# Patient Record
Sex: Female | Born: 1960 | Race: White | Hispanic: No | Marital: Married | Smoking: Never smoker
Health system: Southern US, Community
[De-identification: ages and names within clinical notes are randomized; demographics above are authoritative.]

## PROBLEM LIST (undated history)

## (undated) DIAGNOSIS — G43909 Migraine, unspecified, not intractable, without status migrainosus: Secondary | ICD-10-CM

## (undated) HISTORY — DX: Migraine, unspecified, not intractable, without status migrainosus: G43.909

---

## 1978-07-04 HISTORY — PX: BREAST SURGERY: SHX581

## 2001-06-21 ENCOUNTER — Encounter: Payer: Self-pay | Admitting: Family Medicine

## 2001-06-21 LAB — CONVERTED CEMR LAB
RBC count: 4.66 10*6/uL
TSH: 2.24 microintl units/mL
WBC, blood: 4.8 10*3/uL

## 2004-06-04 ENCOUNTER — Ambulatory Visit: Payer: Self-pay | Admitting: Family Medicine

## 2004-07-20 ENCOUNTER — Ambulatory Visit: Payer: Self-pay | Admitting: General Surgery

## 2004-08-31 ENCOUNTER — Ambulatory Visit: Payer: Self-pay | Admitting: Family Medicine

## 2004-08-31 LAB — CONVERTED CEMR LAB: TSH: 1.61 microintl units/mL

## 2005-05-19 ENCOUNTER — Ambulatory Visit: Payer: Self-pay | Admitting: Family Medicine

## 2005-12-29 ENCOUNTER — Ambulatory Visit: Payer: Self-pay | Admitting: General Surgery

## 2006-01-23 IMAGING — CT CT ABD-PELV W/ CM
1 of 5 series · 14 of 32 positions shown, 19 images · non-contrast
Comparison: none

REASON FOR EXAM: LT lower abd mass, abd pain
COMMENTS:

[Series 3: inspace · axial · 0.53mm/px · z∈[-477,-120]mm · 14 of 399 slices shown, 19 images]
[im 21/399  soft-tissue]
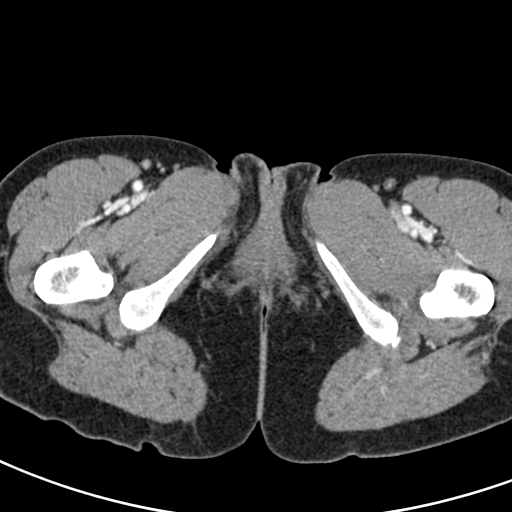
[im 21/399  bone]
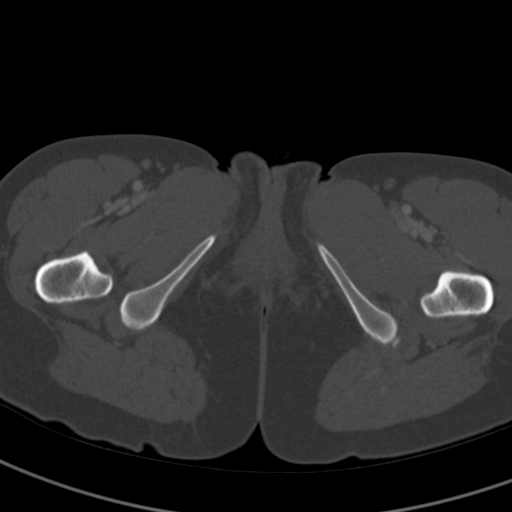
[im 63/399  soft-tissue]
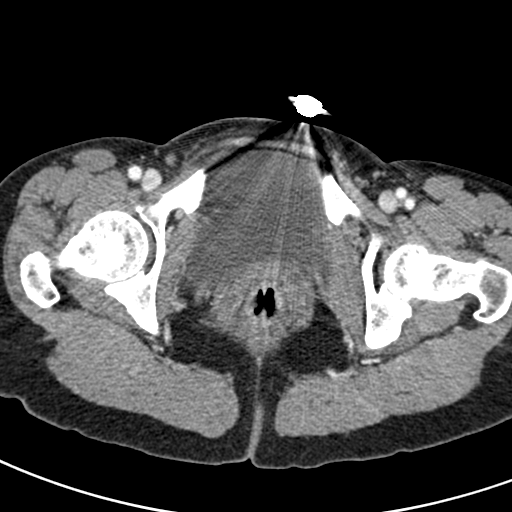
[im 84/399  soft-tissue]
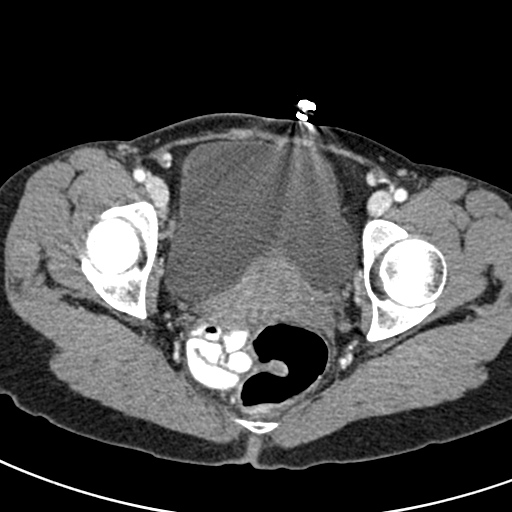
[im 105/399  soft-tissue]
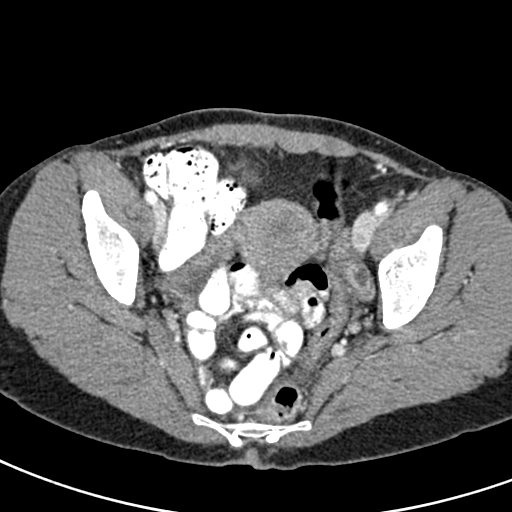
[im 147/399  soft-tissue]
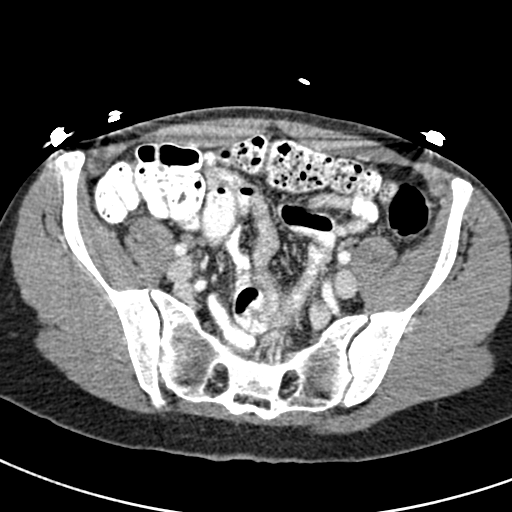
[im 168/399  soft-tissue]
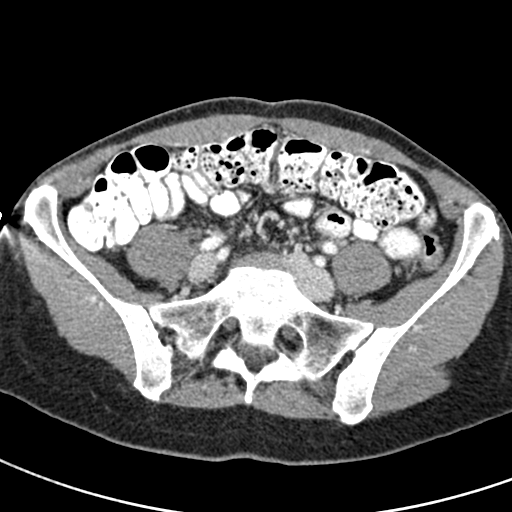
[im 210/399  soft-tissue]
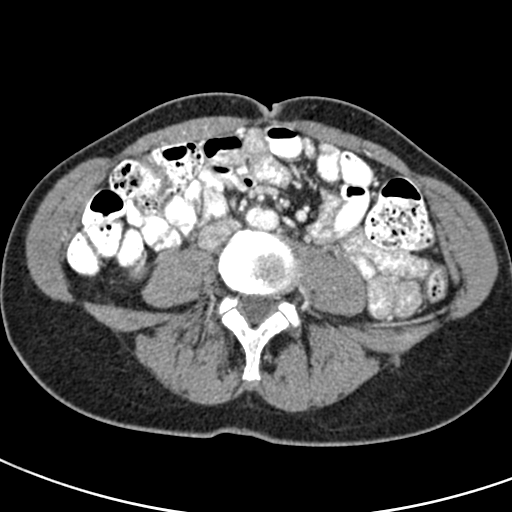
[im 231/399  soft-tissue]
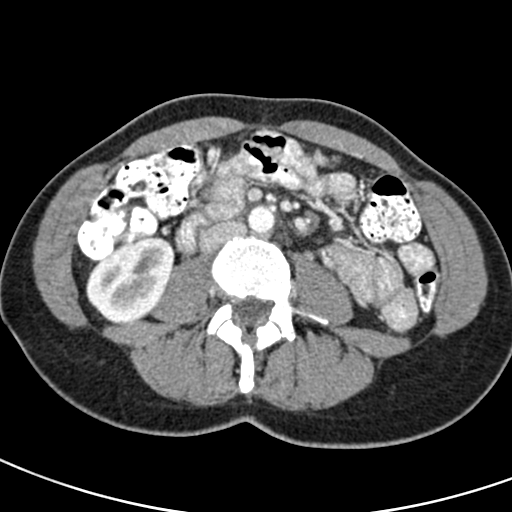
[im 252/399  soft-tissue]
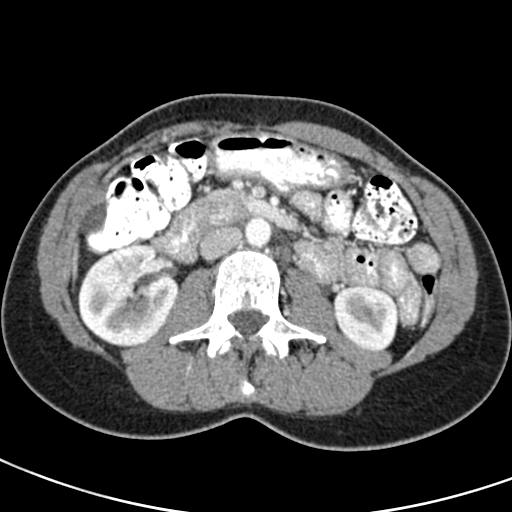
[im 252/399  bone]
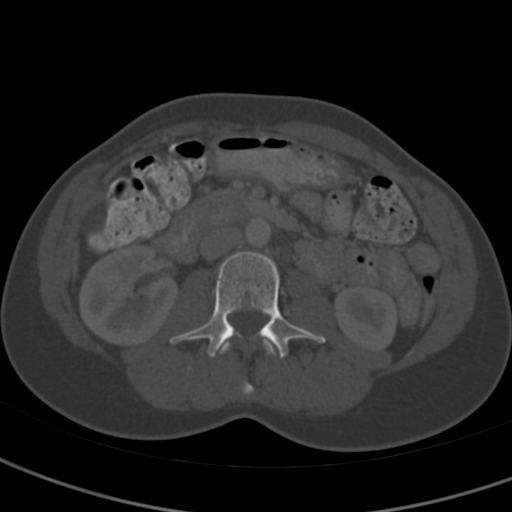
[im 294/399  soft-tissue]
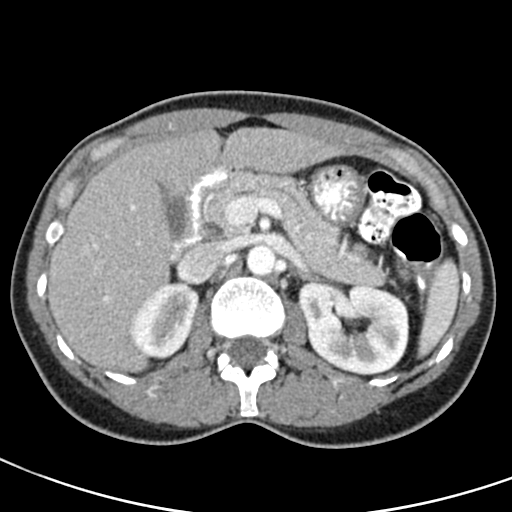
[im 315/399  soft-tissue]
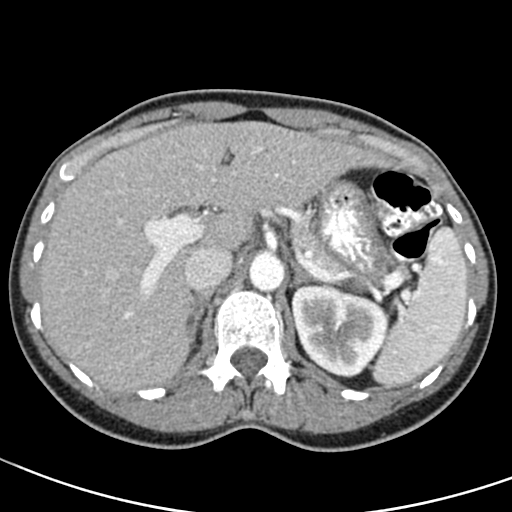
[im 315/399  lung]
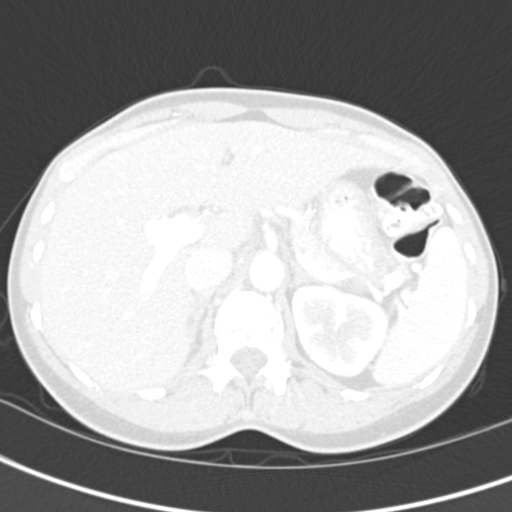
[im 336/399  soft-tissue]
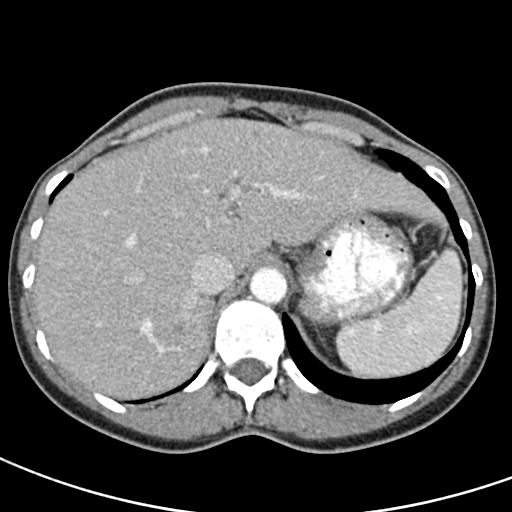
[im 336/399  lung]
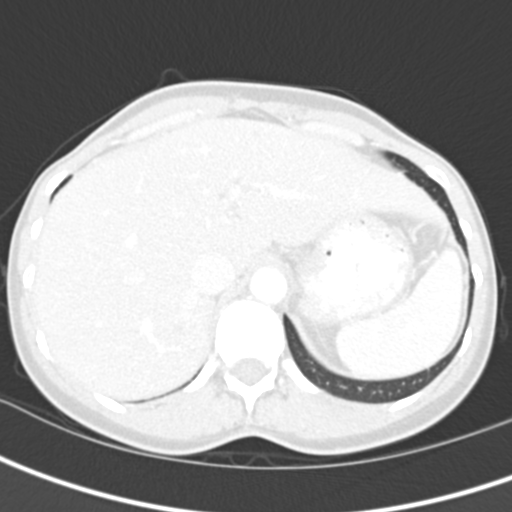
[im 357/399  lung]
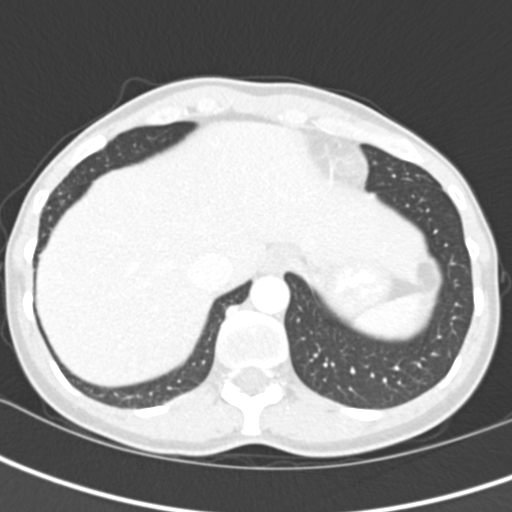
[im 378/399  soft-tissue]
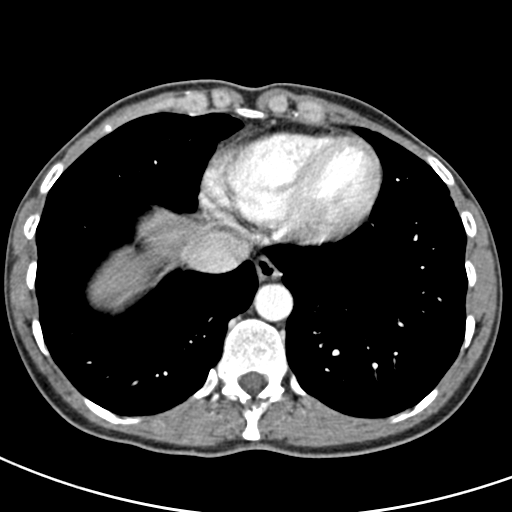
[im 378/399  lung]
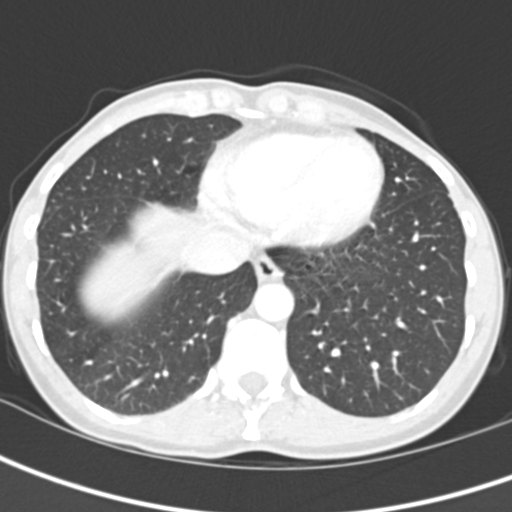

[14 of 32 positions shown; findings below may reference images not displayed]

PROCEDURE:     CT  - CT ABDOMEN / PELVIS  W  - July 20, 2004 [DATE]

RESULT:     Spiral 8 mm sections were obtained from the lung bases through
the pubic symphysis status post intravenous administration of 100 ccs of
Isovue 370.

Evaluation of the lung bases demonstrated no evidence of focal infiltrates,
effusions or edema.

The liver demonstrates an area of low attenuation along the anteroapical
periphery of the RIGHT lobe of the liver. This area demonstrates no evidence
of enhancement on immediate and delayed images and likely represents a cyst.
 Note; an atypical hemangioma cannot be excluded. A second smaller area of
low attenuation projects along the medial border of the apex of the liver
demonstrating near isointensity of the liver on delayed images and also
likely representing a small hemangioma.  A third area is demonstrated along
the posterior aspect of the LEFT lobe of the liver with no evidence of
enhancement again likely representing a cyst versus an atypical hemangioma.
No further liver masses are demonstrated.  The spleen, pancreas, adrenals
and kidneys are unremarkable.  There is no evidence of abdominopelvic
masses, free fluid, nor drainable loculated fluid collections.
IMPRESSION: 1)Likely cysts as well as possible small hemangiomas within the liver.
Otherwise, no further abdominal masses, free fluid nor drainable loculated
fluid collections are appreciated.

## 2006-05-17 ENCOUNTER — Ambulatory Visit: Payer: Self-pay | Admitting: Family Medicine

## 2006-06-03 ENCOUNTER — Encounter: Payer: Self-pay | Admitting: Family Medicine

## 2006-06-03 LAB — CONVERTED CEMR LAB: Pap Smear: NORMAL

## 2006-08-07 ENCOUNTER — Ambulatory Visit: Payer: Self-pay | Admitting: Family Medicine

## 2006-08-07 LAB — CONVERTED CEMR LAB
ALT: 30 units/L (ref 0–40)
AST: 32 units/L (ref 0–37)
Albumin: 3.6 g/dL (ref 3.5–5.2)
Alkaline Phosphatase: 28 units/L — ABNORMAL LOW (ref 39–117)
BUN: 14 mg/dL (ref 6–23)
Basophils Absolute: 0 10*3/uL (ref 0.0–0.1)
Basophils Relative: 0.5 % (ref 0.0–1.0)
Bilirubin, Direct: 0.1 mg/dL (ref 0.0–0.3)
Blood Glucose, Fasting: 92 mg/dL
CO2: 30 meq/L (ref 19–32)
Calcium: 8.8 mg/dL (ref 8.4–10.5)
Chloride: 110 meq/L (ref 96–112)
Cholesterol: 123 mg/dL (ref 0–200)
Creatinine, Ser: 0.7 mg/dL (ref 0.4–1.2)
Eosinophils Absolute: 0.1 10*3/uL (ref 0.0–0.6)
Eosinophils Relative: 1.7 % (ref 0.0–5.0)
GFR calc Af Amer: 115 mL/min
GFR calc non Af Amer: 95 mL/min
Glucose, Bld: 92 mg/dL (ref 70–99)
HCT: 38.2 % (ref 36.0–46.0)
HDL: 43.9 mg/dL (ref >39.0)
Hemoglobin: 13.2 g/dL (ref 12.0–15.0)
LDL Cholesterol: 70 mg/dL (ref 0–99)
Lymphocytes Relative: 22.2 % (ref 12.0–46.0)
MCHC: 34.5 g/dL (ref 30.0–36.0)
MCV: 91.5 fL (ref 78.0–100.0)
Monocytes Absolute: 0.3 10*3/uL (ref 0.2–0.7)
Monocytes Relative: 8.3 % (ref 3.0–11.0)
Neutro Abs: 2.6 10*3/uL (ref 1.4–7.7)
Neutrophils Relative %: 67.3 % (ref 43.0–77.0)
Platelets: 236 10*3/uL (ref 150–400)
Potassium: 4.6 meq/L (ref 3.5–5.1)
RBC: 4.17 M/uL (ref 3.87–5.11)
RDW: 11.9 % (ref 11.5–14.6)
Sodium: 143 meq/L (ref 135–145)
TSH: 1.41 microintl units/mL (ref 0.35–5.50)
Total Bilirubin: 0.5 mg/dL (ref 0.3–1.2)
Total CHOL/HDL Ratio: 2.8
Total Protein: 6 g/dL (ref 6.0–8.3)
Triglycerides: 44 mg/dL (ref 0–149)
VLDL: 9 mg/dL (ref 0–40)
WBC: 3.9 10*3/uL — ABNORMAL LOW (ref 4.5–10.5)

## 2006-08-09 ENCOUNTER — Ambulatory Visit: Payer: Self-pay | Admitting: Family Medicine

## 2007-05-18 ENCOUNTER — Ambulatory Visit: Payer: Self-pay | Admitting: Family Medicine

## 2007-08-22 ENCOUNTER — Ambulatory Visit: Payer: Self-pay | Admitting: Family Medicine

## 2007-08-22 LAB — CONVERTED CEMR LAB
CO2: 29 meq/L (ref 19–32)
Chloride: 108 meq/L (ref 96–112)
Cholesterol: 151 mg/dL (ref 0–200)
GFR calc non Af Amer: 113 mL/min
Glucose, Bld: 90 mg/dL (ref 70–99)
HDL: 58.6 mg/dL (ref >39.0)
LDL Cholesterol: 84 mg/dL (ref 0–99)
Sodium: 140 meq/L (ref 135–145)
TSH: 1.72 microintl units/mL (ref 0.35–5.50)
Total CHOL/HDL Ratio: 2.6

## 2007-08-28 ENCOUNTER — Ambulatory Visit: Payer: Self-pay | Admitting: Family Medicine

## 2007-08-28 DIAGNOSIS — G43109 Migraine with aura, not intractable, without status migrainosus: Secondary | ICD-10-CM | POA: Insufficient documentation

## 2007-08-29 ENCOUNTER — Encounter: Payer: Self-pay | Admitting: Family Medicine

## 2007-08-29 ENCOUNTER — Telehealth: Payer: Self-pay | Admitting: Family Medicine

## 2007-08-30 ENCOUNTER — Encounter: Payer: Self-pay | Admitting: Family Medicine

## 2007-09-24 ENCOUNTER — Telehealth: Payer: Self-pay | Admitting: Family Medicine

## 2008-03-24 ENCOUNTER — Encounter: Payer: Self-pay | Admitting: Family Medicine

## 2008-04-21 ENCOUNTER — Encounter: Payer: Self-pay | Admitting: Family Medicine

## 2008-06-10 ENCOUNTER — Telehealth: Payer: Self-pay | Admitting: Family Medicine

## 2008-10-20 ENCOUNTER — Ambulatory Visit: Payer: Self-pay | Admitting: Family Medicine

## 2008-10-20 LAB — CONVERTED CEMR LAB
ALT: 20 units/L (ref 0–35)
AST: 24 units/L (ref 0–37)
BUN: 16 mg/dL (ref 6–23)
Basophils Absolute: 0 10*3/uL (ref 0.0–0.1)
Basophils Relative: 0.1 % (ref 0.0–3.0)
Bilirubin, Direct: 0 mg/dL (ref 0.0–0.3)
Chloride: 111 meq/L (ref 96–112)
Cholesterol: 153 mg/dL (ref 0–200)
Eosinophils Absolute: 0.1 10*3/uL (ref 0.0–0.7)
GFR calc non Af Amer: 112.56 mL/min (ref >60)
HDL: 72.3 mg/dL (ref >39.00)
LDL Cholesterol: 73 mg/dL (ref 0–99)
Lymphocytes Relative: 18.8 % (ref 12.0–46.0)
MCHC: 34.3 g/dL (ref 30.0–36.0)
MCV: 92.2 fL (ref 78.0–100.0)
Monocytes Absolute: 0.4 10*3/uL (ref 0.1–1.0)
Neutrophils Relative %: 69.6 % (ref 43.0–77.0)
Platelets: 194 10*3/uL (ref 150.0–400.0)
Potassium: 4.5 meq/L (ref 3.5–5.1)
RBC: 4.06 M/uL (ref 3.87–5.11)
RDW: 12.7 % (ref 11.5–14.6)
Sodium: 144 meq/L (ref 135–145)
TSH: 2.1 microintl units/mL (ref 0.35–5.50)
Total Bilirubin: 0.6 mg/dL (ref 0.3–1.2)
Triglycerides: 41 mg/dL (ref 0.0–149.0)
VLDL: 8.2 mg/dL (ref 0.0–40.0)

## 2008-10-22 ENCOUNTER — Ambulatory Visit: Payer: Self-pay | Admitting: Family Medicine

## 2009-03-13 LAB — HM COLONOSCOPY: HM Colonoscopy: NORMAL

## 2009-04-01 ENCOUNTER — Ambulatory Visit: Payer: Self-pay | Admitting: Gastroenterology

## 2009-04-01 ENCOUNTER — Encounter: Payer: Self-pay | Admitting: Family Medicine

## 2009-04-08 ENCOUNTER — Encounter: Payer: Self-pay | Admitting: Family Medicine

## 2009-05-12 ENCOUNTER — Ambulatory Visit: Payer: Self-pay | Admitting: Family Medicine

## 2010-02-04 ENCOUNTER — Encounter (INDEPENDENT_AMBULATORY_CARE_PROVIDER_SITE_OTHER): Payer: Self-pay | Admitting: *Deleted

## 2010-04-20 ENCOUNTER — Encounter: Payer: Self-pay | Admitting: Family Medicine

## 2010-04-28 ENCOUNTER — Ambulatory Visit: Payer: Self-pay | Admitting: Family Medicine

## 2010-05-10 ENCOUNTER — Telehealth: Payer: Self-pay | Admitting: Family Medicine

## 2010-05-11 ENCOUNTER — Telehealth: Payer: Self-pay | Admitting: Family Medicine

## 2010-05-14 ENCOUNTER — Ambulatory Visit: Payer: Self-pay | Admitting: Family Medicine

## 2010-05-17 LAB — CONVERTED CEMR LAB
Alkaline Phosphatase: 44 units/L (ref 39–117)
BUN: 20 mg/dL (ref 6–23)
Basophils Relative: 0.6 % (ref 0.0–3.0)
Bilirubin, Direct: 0.1 mg/dL (ref 0.0–0.3)
CO2: 29 meq/L (ref 19–32)
Chloride: 108 meq/L (ref 96–112)
Eosinophils Absolute: 0.1 10*3/uL (ref 0.0–0.7)
Eosinophils Relative: 2.6 % (ref 0.0–5.0)
HCT: 38.5 % (ref 36.0–46.0)
LDL Cholesterol: 101 mg/dL — ABNORMAL HIGH (ref 0–99)
Lymphs Abs: 0.8 10*3/uL (ref 0.7–4.0)
MCHC: 34.2 g/dL (ref 30.0–36.0)
MCV: 92.7 fL (ref 78.0–100.0)
Monocytes Absolute: 0.3 10*3/uL (ref 0.1–1.0)
Platelets: 215 10*3/uL (ref 150.0–400.0)
Potassium: 4.8 meq/L (ref 3.5–5.1)
RBC: 4.15 M/uL (ref 3.87–5.11)
Total Bilirubin: 0.6 mg/dL (ref 0.3–1.2)
Total CHOL/HDL Ratio: 2
WBC: 3.3 10*3/uL — ABNORMAL LOW (ref 4.5–10.5)

## 2010-05-19 ENCOUNTER — Ambulatory Visit: Payer: Self-pay | Admitting: Family Medicine

## 2010-05-19 DIAGNOSIS — D72819 Decreased white blood cell count, unspecified: Secondary | ICD-10-CM

## 2010-08-03 NOTE — Letter (Signed)
Summary: Nadara Eaton letter  [facility]  [address] [place], [place] [address]   Phone: [phone]  Fax: [phone]       02/04/2010 MRN: [id number]  [facility] [address] [place], [place]  [address]  Dear Ms. [facility] [place], and [place] announce the retirement of Arta Silence, M.D., from full-time practice at the [facility] office effective December 31, 2009 and his plans of returning part-time.  It is important to Dr. Hetty Ely and to our practice that you understand that [facility] has seven physicians in our office for your health care needs.  We will continue to offer the same exceptional care that you have today.    Dr. Hetty Ely has spoken to many of you about his plans for retirement and returning part-time in the fall.   We will continue to work with you through the transition to schedule appointments for you in the office and meet the high standards that [facility] is committed to.   Again, it is with great pleasure that we share the news that Dr. Hetty Ely will return to [facility] at [facility] in October of 2011 with a reduced schedule.    If you have any questions, or would like to request an appointment with one of our physicians, please call us at [phone] and press the option for Scheduling an appointment.  We take pleasure in providing you with excellent patient care and look forward to seeing you at your next office visit.  Our [facility] Physicians are:  Tillman Abide, M.D. Laurita Quint, M.D. Roxy Manns, M.D. Kerby Nora, M.D. Hannah Beat, M.D. Ruthe Mannan, M.D. We proudly welcomed Raechel Ache, M.D. and Eustaquio Boyden, M.D. to the practice in July/August 2011.  Sincerely,  [facility]

## 2010-08-03 NOTE — Assessment & Plan Note (Signed)
Summary: CPX   Vital Signs:  Patient profile:   50 year old female Height:      62 inches Weight:      110.75 pounds BMI:     20.33 Temp:     98.2 degrees F oral Pulse rate:   72 / minute Pulse rhythm:   regular BP sitting:   110 / 72  (left arm) Cuff size:   regular  Vitals Entered By: Sydell Axon LPN (May 19, 2010 2:30 PM) CC: 30 Minute checkup, no pap, sees a GYN   History of Present Illness: Pt here for Comp Exam, sees Dr Alfredo Batty... last seen in jun with nml check.  Had colonoscopy by Dr Bluford Kaufmann 04/01/2009...totally nml. She has no complaints except for joint complaints...heel pain under achilees on right. She takes Celebrex a lot...she uses three to four days for joint pain after running.   Preventive Screening-Counseling & Management  Alcohol-Tobacco     Alcohol drinks/day: <1     Alcohol type: wine 1-2 per two weeks.     Smoking Status: never     Passive Smoke Exposure: no  Caffeine-Diet-Exercise     Caffeine use/day: 1     Does Patient Exercise: yes     Type of exercise: runs/walks      Times/week: 7  Problems Prior to Update: 1)  Neoplasm, Malignant, Breast, Family Hx, Mother  (ICD-V16.3) 2)  Migraine W/aura w/o Intract w/o Status Migrnosus  (ICD-346.00) 3)  Health Maintenance Exam  (ICD-V70.0)  Medications Prior to Update: 1)  Nasonex 50 Mcg/act  Susp (Mometasone Furoate) .... One Inhalation Each Nostril Once A Day. 2)  Imitrex 25 Mg  Tabs (Sumatriptan Succinate) .Marland Kitchen.. 1 Tablet By Mouth Pior To Headaches Brand Name Only  Current Medications (verified): 1)  Nasonex 50 Mcg/act  Susp (Mometasone Furoate) .... One Inhalation Each Nostril Once A Day. 2)  Imitrex 25 Mg  Tabs (Sumatriptan Succinate) .Marland Kitchen.. 1 Tablet By Mouth Pior To Headaches Brand Name Only 3)  Carisoprodol 250 Mg Tabs (Carisoprodol) .... Take One By Mouth  Two Times A Day As Needed 4)  Celebrex 200 Mg Caps (Celecoxib) .... Take One By Mouth Daily As Needed  Allergies: No Known Drug  Allergies  Past History:  Past Surgical History: Last updated: 04/06/2009 C/S x 2  first CPD, second because of first Stress Cardiolyte nml 06/09/2003 Colonoscopy nml (Dr Bluford Kaufmann) 04/01/2009  Family History: Last updated: 05/19/2010 Father dec 82 Brain tumor Mother dec 67 Breast Ca mets to bone Brother A 49 (Buddy) Sister A 46 (Luanne) livesw in [place] Sister A  43 (Andria)  CV: +PGF DECEASED FROM MI AT  67 YOA HBP: NEGATIVE DM: NEGATIVE PROSTATE CANCER: GOUT/ARTHRITIS:BREAST CANCER: + MOTHER  OVARIAN/UTERINE CANCER: NEGATIVE COLON CANCER: BRAIN CANCER FATHER DEPRESSION: NEGATIVE ETOH/DRUG ABUSE: + FATHER QUIT LATER IN LIFE OTHER: +PGM 55 YOA DECEASED FROM STROKE  Social History: Last updated: 04/21/2010 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  Works at [employer] Married Lives w/ husb  two daughters,  Grandmother  Risk Factors: Alcohol Use: <1 (05/19/2010) Caffeine Use: 1 (05/19/2010) Exercise: yes (05/19/2010)  Risk Factors: Smoking Status: never (05/19/2010) Passive Smoke Exposure: no (05/19/2010)  Family History: Father dec 82 Brain tumor Mother dec 67 Breast Ca mets to bone Brother A 49 (Buddy) Sister A 46 (Luanne) livesw in [place] Sister A  43 (Andria)  CV: +PGF DECEASED FROM MI AT  58 YOA HBP: NEGATIVE DM: NEGATIVE PROSTATE CANCER: GOUT/ARTHRITIS:BREAST CANCER: + MOTHER  OVARIAN/UTERINE CANCER: NEGATIVE COLON CANCER: BRAIN  CANCER FATHER DEPRESSION: NEGATIVE ETOH/DRUG ABUSE: + FATHER QUIT LATER IN LIFE OTHER: +PGM 42 YOA DECEASED FROM STROKE  Review of Systems General:  Complains of sweats; denies chills, fatigue, fever, weakness, and weight loss; occas from menopause.. Eyes:  Denies blurring, eye pain, and itching. ENT:  Denies decreased hearing, ear discharge, earache, and ringing in ears. CV:  Denies chest pain or discomfort, fainting, fatigue, palpitations, shortness of breath with exertion, swelling of feet, and swelling of hands. Resp:  Denies cough,  shortness of breath, and wheezing. GI:  Denies abdominal pain, bloody stools, change in bowel habits, constipation, dark tarry stools, diarrhea, indigestion, loss of appetite, nausea, vomiting, vomiting blood, and yellowish skin color. GU:  Denies discharge, dysuria, nocturia, and urinary frequency. MS:  Complains of joint pain and low back pain; denies muscle aches, cramps, and stiffness. Derm:  Denies dryness, itching, and rash. Neuro:  Denies numbness, poor balance, tingling, and tremors.  Physical Exam  General:  Well-developed,well-nourished,in no acute distress; alert,appropriate and cooperative throughout examination Head:  Normocephalic and atraumatic without obvious abnormalities. No apparent alopecia or balding. Sinuses NT. Eyes:  Conjunctiva clear bilaterally.  Ears:  External ear exam shows no significant lesions or deformities.  Otoscopic examination reveals clear canals, tympanic membranes are intact bilaterally without bulging, retraction, inflammation or discharge. Hearing is grossly normal bilaterally. Nose:  External nasal examination shows no deformity or inflammation. Nasal mucosa are pink and moist without lesions or exudates. Mouth:  Oral mucosa and oropharynx without lesions or exudates.  Teeth in good repair. Neck:  No deformities, masses, or tenderness noted. Chest Wall:  No deformities, masses, or tenderness noted. Breasts:  not done. Lungs:  Normal respiratory effort, chest expands symmetrically. Lungs are clear to auscultation, no crackles or wheezes. Heart:  Normal rate and regular rhythm. S1 and S2 normal without gallop, murmur, click, rub or other extra sounds. Abdomen:  Bowel sounds positive,abdomen soft and non-tender without masses, organomegaly or hernias noted. Rectal:  not done Genitalia:  not done Msk:  No deformity or scoliosis noted of thoracic or lumbar spine.   Pulses:  R and L carotid,radial,femoral,dorsalis pedis and posterior tibial pulses are  full and equal bilaterally Extremities:  No clubbing, cyanosis, edema, or deformity noted with normal full range of motion of all joints.   Neurologic:  No cranial nerve deficits noted. Station and gait are normal. Sensory, motor and coordinative functions appear intact. Skin:  Intact without suspicious lesions or rashes Cervical Nodes:  No lymphadenopathy noted Inguinal Nodes:  No significant adenopathy Psych:  Cognition and judgment appear intact. Alert and cooperative with normal attention span and concentration. No apparent delusions, illusions, hallucinations   Impression & Recommendations:  Problem # 1:  HEALTH MAINTENANCE EXAM (ICD-V70.0) Assessment Comment Only Protocols discussed, immunizations UTD, will give Zostavax at 60, Pneumovax at 65 unless other developments.  Problem # 2:  MIGRAINE W/AURA W/O INTRACT W/O STATUS MIGRNOSUS (ICD-346.00) Assessment: Unchanged  Stable and Imitrex still works well. Cont. Her updated medication list for this problem includes:    Imitrex 25 Mg Tabs (Sumatriptan succinate) .Marland Kitchen... 1 tablet by mouth pior to headaches brand name only    Celebrex 200 Mg Caps (Celecoxib) .Marland Kitchen... Take one by mouth daily as needed  Problem # 3:  NEOPLASM, MALIGNANT, BREAST, FAMILY HX, MOTHER (ICD-V16.3) Assessment: Unchanged Exams UTD. Colonoscopy nml.  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 Spleen nml, no S/S of infection, appears o/w healthy. Will follow.  Complete  Medication List: 1)  Nasonex 50 Mcg/act Susp (Mometasone furoate) .... One inhalation each nostril once a day. 2)  Imitrex 25 Mg Tabs (Sumatriptan succinate) .Marland Kitchen.. 1 tablet by mouth pior to headaches brand name only 3)  Carisoprodol 250 Mg Tabs (Carisoprodol) .... Take one by mouth  two times a day as needed 4)  Celebrex 200 Mg Caps (Celecoxib) .... Take one by mouth daily as needed  Patient Instructions: 1)  RTC one year, sooner as  needed. Prescriptions: IMITREX 25 MG  TABS (SUMATRIPTAN SUCCINATE) 1 tablet by mouth pior to headaches brand name only  #10 x 6   Entered and Authorized by:   Shaune Leeks MD   Signed by:   Shaune Leeks MD on 05/19/2010   Method used:   Print then Give to Patient   RxID:   [id number]    Orders Added: 1)  Est. Patient 40-64 years [36144]    Current Allergies (reviewed today): No known allergies   Appended Document: CPX    Clinical Lists Changes  Observations: Added new observation of COLONOSCOPY: normal (04/01/2009 15:01)       Preventive Care Screening  Colonoscopy:    Date:  04/01/2009    Results:  normal  Appended Document: CPX Rx called to [facility] per Dr. Hetty Ely.

## 2010-08-03 NOTE — Progress Notes (Signed)
Summary: Imitrex  Phone Note Refill Request Call back at [phone] Message from:  [facility]  Refills Requested: Medication #1:  IMITREX 25 MG  TABS 1 tablet by mouth pior to headaches brand name only.   Last Refilled: 03/23/2010  Method Requested: Electronic Initial call taken by: Sydell Axon LPN,  May 10, 2010 1:52 PM  Follow-up for Phone Call        Please call in.  Please call patient.  last OV >1 year ago.  Needs 30 min OV.  Follow-up by: Crawford Givens MD,  May 10, 2010 1:56 PM  Additional Follow-up for Phone Call Additional follow up Details #1::        Left message on machine for patient to call back. Sydell Axon LPN  May 10, 2010 4:11 PM  Advised pt, medicine called to [facility], cpx appt scheduled. Additional Follow-up by: Lowella Petties CMA, AAMA,  May 11, 2010 8:44 AM    Prescriptions: IMITREX 25 MG  TABS (SUMATRIPTAN SUCCINATE) 1 tablet by mouth pior to headaches brand name only  #10 x 0   Entered and Authorized by:   Crawford Givens MD   Signed by:   Crawford Givens MD on 05/10/2010   Method used:   Telephoned to ...         RxID:   [id number]

## 2010-08-03 NOTE — Assessment & Plan Note (Signed)
Summary: SCHALLER FLU SHOT/[facility]   Nurse Visit   Allergies: No Known Drug Allergies  Orders Added: 1)  Admin 1st Vaccine [90471] 2)  Flu Vaccine 3yrs + [90658] Flu Vaccine Consent Questions     Do you have a history of severe allergic reactions to this vaccine? no    Any prior history of allergic reactions to egg and/or gelatin? no    Do you have a sensitivity to the preservative Thimersol? no    Do you have a past history of Guillan-Barre Syndrome? no    Do you currently have an acute febrile illness? no    Have you ever had a severe reaction to latex? no    Vaccine information given and explained to patient? yes    Are you currently pregnant? no    Lot Number:[id number]   Exp Date:01/01/2011   Site Given  Left Deltoid IM1  

## 2010-08-03 NOTE — Progress Notes (Signed)
Summary: ? labs prior to physical  Phone Note Call from Patient Call back at [phone]   Caller: Patient Call For: Shaune Leeks MD Summary of Call: Pt is coming in for physical on 11/16, do you want to order labs prior? Initial call taken by: Lowella Petties CMA, AAMA,  May 11, 2010 8:45 AM  Follow-up for Phone Call        Yes, pls schedule fasting labs. Lab will request lab orders. Follow-up by: Shaune Leeks MD,  May 11, 2010 7:57 PM  Additional Follow-up for Phone Call Additional follow up Details #1::        Pt has already scheduled lab appt   Additional Follow-up by: Lowella Petties CMA, AAMA,  May 12, 2010 12:31 PM

## 2010-11-02 ENCOUNTER — Other Ambulatory Visit: Payer: Self-pay | Admitting: Family Medicine

## 2010-11-02 NOTE — Telephone Encounter (Signed)
Is it okay to refill this ?  

## 2010-11-02 NOTE — Telephone Encounter (Signed)
Approved and, I think, sent.

## 2011-04-13 ENCOUNTER — Other Ambulatory Visit: Payer: Self-pay | Admitting: Family Medicine

## 2011-04-13 NOTE — Telephone Encounter (Signed)
Received refill request electronically from pharmacy. Is it okay to refill? 

## 2011-06-08 ENCOUNTER — Ambulatory Visit (INDEPENDENT_AMBULATORY_CARE_PROVIDER_SITE_OTHER): Payer: 59

## 2011-06-08 DIAGNOSIS — Z23 Encounter for immunization: Secondary | ICD-10-CM

## 2011-08-09 ENCOUNTER — Encounter: Payer: Self-pay | Admitting: Internal Medicine

## 2011-08-09 ENCOUNTER — Ambulatory Visit (INDEPENDENT_AMBULATORY_CARE_PROVIDER_SITE_OTHER): Payer: 59 | Admitting: Internal Medicine

## 2011-08-09 VITALS — BP 110/80 | HR 72 | Temp 98.0°F | Ht 62.25 in | Wt 113.0 lb

## 2011-08-09 DIAGNOSIS — R5381 Other malaise: Secondary | ICD-10-CM

## 2011-08-09 DIAGNOSIS — M810 Age-related osteoporosis without current pathological fracture: Secondary | ICD-10-CM | POA: Insufficient documentation

## 2011-08-09 DIAGNOSIS — G43009 Migraine without aura, not intractable, without status migrainosus: Secondary | ICD-10-CM | POA: Insufficient documentation

## 2011-08-09 DIAGNOSIS — M7750 Other enthesopathy of unspecified foot: Secondary | ICD-10-CM

## 2011-08-09 DIAGNOSIS — R5383 Other fatigue: Secondary | ICD-10-CM

## 2011-08-09 DIAGNOSIS — Z13220 Encounter for screening for lipoid disorders: Secondary | ICD-10-CM

## 2011-08-09 DIAGNOSIS — N926 Irregular menstruation, unspecified: Secondary | ICD-10-CM

## 2011-08-09 DIAGNOSIS — Z780 Asymptomatic menopausal state: Secondary | ICD-10-CM | POA: Insufficient documentation

## 2011-08-09 DIAGNOSIS — E559 Vitamin D deficiency, unspecified: Secondary | ICD-10-CM

## 2011-08-09 DIAGNOSIS — Z13820 Encounter for screening for osteoporosis: Secondary | ICD-10-CM

## 2011-08-09 DIAGNOSIS — M539 Dorsopathy, unspecified: Secondary | ICD-10-CM

## 2011-08-09 DIAGNOSIS — M62830 Muscle spasm of back: Secondary | ICD-10-CM

## 2011-08-09 MED ORDER — CARISOPRODOL 350 MG PO TABS: 350.0000 mg | ORAL_TABLET | Freq: Three times a day (TID) | ORAL | Status: AC | PRN

## 2011-08-09 NOTE — Assessment & Plan Note (Signed)
We discussed her current symptoms and the level of discomfort she was having with them. She is not interested in starting hormone therapy as her symptoms are not that severe. We did discuss a trial of Benadryl at night to see if that'll help her sleep through her hot flashes. If this is not worker to discuss with a trial of low-dose alprazolam for his Lexapro or Effexor.

## 2011-08-09 NOTE — Progress Notes (Signed)
  Subjective:    Patient ID: Cynthia Knight, female    DOB: 01/26/1961, 51 y.o.   MRN: [id number]  HPI  Cynthia Knight is a very healthy 51 year old white female who is here to establish primary care.  She has a history of a left  achilles enthesopathy which was apparently caused by running.  She was prescribed Celebrex but noted that her pain improved after she started taking mega doses of vitamin D,  5000 units daily  which she did for a total of 6 months with complete resolution of pain after celebrex did not resolve the pain.   the time she also she stopped running for 6 weeks ,  Stopped the megadose of vit d 5 to 6 months ago,   and is n0w taking 1000 units daily.   she has no personal history of osteoporosis but her mother had osteoporosis.   the last 6-8 months she has been having hot flashes which have been interrupting her sleep cycle .  The  hot flashes wake her up and cause her to remain awake the rest of the night ,    her last Last menses  was two years ago.    Past Medical History  Diagnosis Date  . Migraine headache     previously hormonal,   twice month currently   Current Outpatient Prescriptions on File Prior to Visit  Medication Sig Dispense Refill  . SUMAtriptan (IMITREX) 25 MG tablet TAKE 1 TABLET BY MOUTH PRIOR TO HEADACHE  10 tablet  5      Review of Systems  Constitutional: Negative for fever, chills and unexpected weight change.  HENT: Negative for hearing loss, ear pain, nosebleeds, congestion, sore throat, facial swelling, rhinorrhea, sneezing, mouth sores, trouble swallowing, neck pain, neck stiffness, voice change, postnasal drip, sinus pressure, tinnitus and ear discharge.   Eyes: Negative for pain, discharge, redness and visual disturbance.  Respiratory: Negative for cough, chest tightness, shortness of breath, wheezing and stridor.   Cardiovascular: Negative for chest pain, palpitations and leg swelling.  Musculoskeletal: Negative for myalgias and arthralgias.    Skin: Negative for color change and rash.  Neurological: Negative for dizziness, weakness, light-headedness and headaches.  Hematological: Negative for adenopathy.       Objective:   Physical Exam  Constitutional: She is oriented to person, place, and time. She appears well-developed and well-nourished.  HENT:  Mouth/Throat: Oropharynx is clear and moist.  Eyes: EOM are normal. Pupils are equal, round, and reactive to light. No scleral icterus.  Neck: Normal range of motion. Neck supple. No JVD present. No thyromegaly present.  Cardiovascular: Normal rate, regular rhythm, normal heart sounds and intact distal pulses.   Pulmonary/Chest: Effort normal and breath sounds normal.  Abdominal: Soft. Bowel sounds are normal. She exhibits no mass. There is no tenderness.  Musculoskeletal: Normal range of motion. She exhibits no edema.  Lymphadenopathy:    She has no cervical adenopathy.  Neurological: She is alert and oriented to person, place, and time.  Skin: Skin is warm and dry.  Psychiatric: She has a normal mood and affect.          Assessment & Plan:

## 2011-08-09 NOTE — Patient Instructions (Signed)
dipenhydramine (generic enadryl) 25 to 50 mg at bedtime fore the sedating effects.  We can always try alprazolam,  A short acting sedative for prn use when you are woken up,  Or daily lexapro to mitigate hot flashes.. (all are nonhormonal)

## 2011-08-10 DIAGNOSIS — M7750 Other enthesopathy of unspecified foot: Secondary | ICD-10-CM | POA: Insufficient documentation

## 2011-08-10 NOTE — Assessment & Plan Note (Signed)
Secondary to her strain of the Achilles tendon during running. Her symptoms have completely resolved after vitamin D therapy and rest.

## 2011-08-10 NOTE — Assessment & Plan Note (Signed)
She has several risk factors including weight less than 125 pounds postmenopausal status and family history. Screening DEXA scan has been ordered today to be done at [facility] which is where  she gets her mammograms done.

## 2011-08-14 LAB — HM MAMMOGRAPHY: HM Mammogram: NORMAL

## 2011-08-14 LAB — HM DEXA SCAN

## 2011-08-16 ENCOUNTER — Encounter: Payer: Self-pay | Admitting: Internal Medicine

## 2011-08-16 ENCOUNTER — Other Ambulatory Visit (INDEPENDENT_AMBULATORY_CARE_PROVIDER_SITE_OTHER): Payer: 59 | Admitting: *Deleted

## 2011-08-16 DIAGNOSIS — Z13220 Encounter for screening for lipoid disorders: Secondary | ICD-10-CM

## 2011-08-16 DIAGNOSIS — E559 Vitamin D deficiency, unspecified: Secondary | ICD-10-CM

## 2011-08-16 DIAGNOSIS — R5381 Other malaise: Secondary | ICD-10-CM

## 2011-08-16 DIAGNOSIS — R5383 Other fatigue: Secondary | ICD-10-CM

## 2011-08-16 LAB — CBC WITH DIFFERENTIAL/PLATELET
Basophils Relative: 0.7 % (ref 0.0–3.0)
Eosinophils Absolute: 0.1 10*3/uL (ref 0.0–0.7)
Eosinophils Relative: 3.6 % (ref 0.0–5.0)
Hemoglobin: 12.8 g/dL (ref 12.0–15.0)
Lymphocytes Relative: 28.7 % (ref 12.0–46.0)
Monocytes Relative: 9.7 % (ref 3.0–12.0)
Neutrophils Relative %: 57.3 % (ref 43.0–77.0)
RBC: 4.17 Mil/uL (ref 3.87–5.11)
WBC: 3.6 10*3/uL — ABNORMAL LOW (ref 4.5–10.5)

## 2011-08-16 LAB — LIPID PANEL: VLDL: 16.4 mg/dL (ref 0.0–40.0)

## 2011-08-16 NOTE — Progress Notes (Signed)
Addended by: Melody Comas L on: 08/16/2011 11:09 AM   Modules accepted: Orders

## 2011-08-17 ENCOUNTER — Encounter: Payer: Self-pay | Admitting: Internal Medicine

## 2011-08-17 ENCOUNTER — Telehealth: Payer: Self-pay | Admitting: *Deleted

## 2011-08-17 LAB — COMPLETE METABOLIC PANEL WITH GFR
Albumin: 4.6 g/dL (ref 3.5–5.2)
Alkaline Phosphatase: 54 U/L (ref 39–117)
CO2: 25 mEq/L (ref 19–32)
Calcium: 9.2 mg/dL (ref 8.4–10.5)
Chloride: 106 mEq/L (ref 96–112)
GFR, Est Non African American: 88 mL/min
Glucose, Bld: 93 mg/dL (ref 70–99)
Potassium: 4.5 mEq/L (ref 3.5–5.3)
Sodium: 141 mEq/L (ref 135–145)
Total Protein: 6.4 g/dL (ref 6.0–8.3)

## 2011-08-17 LAB — VITAMIN D 25 HYDROXY (VIT D DEFICIENCY, FRACTURES): Vit D, 25-Hydroxy: 63 ng/mL (ref 30–89)

## 2011-08-17 NOTE — Telephone Encounter (Signed)
Pt has appt for bone density today and [facility] is asking that an order be sent to them, fax from them is in red folder.

## 2011-08-17 NOTE — Telephone Encounter (Signed)
.   Dr. Darrick Huntsman, I know this is a silly question but have you heard of a supplement called 5-HTP? The nurse that gave me my bone scan said she "loved" it. She said it helped with her overall well-being and helped with her sleeping. ???? I would love to know what you think before I go out and buy it.  Cynthia Knight

## 2011-08-17 NOTE — Telephone Encounter (Signed)
I have not heard anything about 5 HT .  Unfortunately there are so many supplements out there that I can't keep up with them all and what they claim to do.  If she would like to send me some information on it, I will be happy to review it.

## 2011-08-17 NOTE — Telephone Encounter (Signed)
Left message on machine asking pt to call back. 

## 2011-08-17 NOTE — Telephone Encounter (Deleted)
Patient is asking if you have heard of a supp

## 2011-08-18 NOTE — Telephone Encounter (Signed)
[address] [place], [place] [address] p. [phone] f. [phone] To: [facility] (Daytime Triage) Fax: [phone] From: [facility] Date/ Time: 08/18/2011 2:05 PM Taken By: Di Kindle, RN Caller: MaryLou Facility: not collected Patient: Klee, Kolek DOB: 09/18/1960 Phone: [phone] Reason for Call: Pt states missed call from Jacki Cones in the office, please call [phone]. Regarding Appointment: Appt Date: Appt Time: Unknown Provider: Reason: Details: Outcome:

## 2011-08-18 NOTE — Telephone Encounter (Signed)
Left detailed message on cell phone advising patient.

## 2011-08-24 ENCOUNTER — Encounter: Payer: Self-pay | Admitting: Internal Medicine

## 2011-09-02 ENCOUNTER — Telehealth: Payer: Self-pay | Admitting: Internal Medicine

## 2011-09-02 NOTE — Telephone Encounter (Signed)
Notified patient of results 

## 2011-09-02 NOTE — Telephone Encounter (Signed)
DEXA scan from [facility] reviewed.  Has moderate osteopenia in the spine an d  normal density in the hips.  No changes to regimen,  Repeat in 2 yrs

## 2011-09-12 ENCOUNTER — Encounter: Payer: Self-pay | Admitting: Internal Medicine

## 2011-09-19 ENCOUNTER — Encounter: Payer: Self-pay | Admitting: Internal Medicine

## 2011-10-19 ENCOUNTER — Encounter: Payer: Self-pay | Admitting: Internal Medicine

## 2011-12-12 ENCOUNTER — Emergency Department: Payer: Self-pay | Admitting: Emergency Medicine

## 2012-03-06 ENCOUNTER — Encounter: Payer: 59 | Admitting: Internal Medicine

## 2012-03-13 ENCOUNTER — Encounter: Payer: Self-pay | Admitting: Internal Medicine

## 2012-03-13 ENCOUNTER — Ambulatory Visit (INDEPENDENT_AMBULATORY_CARE_PROVIDER_SITE_OTHER): Payer: 59 | Admitting: Internal Medicine

## 2012-03-13 VITALS — BP 104/70 | HR 74 | Temp 98.5°F | Resp 14 | Ht 62.0 in | Wt 110.2 lb

## 2012-03-13 DIAGNOSIS — Z1239 Encounter for other screening for malignant neoplasm of breast: Secondary | ICD-10-CM

## 2012-03-13 DIAGNOSIS — Z13820 Encounter for screening for osteoporosis: Secondary | ICD-10-CM

## 2012-03-13 DIAGNOSIS — Z23 Encounter for immunization: Secondary | ICD-10-CM

## 2012-03-13 DIAGNOSIS — Z0000 Encounter for general adult medical examination without abnormal findings: Secondary | ICD-10-CM

## 2012-03-13 DIAGNOSIS — M7750 Other enthesopathy of unspecified foot: Secondary | ICD-10-CM

## 2012-03-13 NOTE — Progress Notes (Signed)
Patient ID: Cynthia Knight, female   DOB: [birth year], 51 y.o.   MRN: [id number]  Patient Active Problem List  Diagnosis  . LEUKOPENIA, MILD  . MIGRAINE W/AURA W/O INTRACT W/O STATUS MIGRNOSUS  . Migraine headache  . Menstrual syndrome  . Screening for osteoporosis  . Enthesopathy of ankle and tarsus  . Routine general medical examination at a health care facility    Subjective:  CC:   Chief Complaint  Patient presents with  . Gynecologic Exam    HPI:   Cynthia Knight a 51 y.o. female who presents  Past Medical History  Diagnosis Date  . Migraine headache     previously hormonal,   twice month currently    Past Surgical History  Procedure Date  . Breast surgery 1980    fibrocystic ,  right breast          The following portions of the patient's history were reviewed and updated as appropriate: Allergies, current medications, and problem list.    Review of Systems:   12 Pt  review of systems was negative except those addressed in the HPI,     History   Social History  . Marital Status: Married    Spouse Name: N/A    Number of Children: N/A  . Years of Education: N/A   Occupational History  . Not on file.   Social History Main Topics  . Smoking status: Never Smoker   . Smokeless tobacco: Never Used  . Alcohol Use: Yes  . Drug Use: No  . Sexually Active: Not on file   Other Topics Concern  . Not on file   Social History Narrative  . No narrative on file    Objective:  BP 104/70  Pulse 74  Temp 98.5 F (36.9 C) (Oral)  Resp 14  Ht 5\' 2"  (1.575 m)  Wt 110 lb 4 oz (50.009 kg)  BMI 20.16 kg/m2  SpO2 96%  General appearance: alert, cooperative and appears stated age Ears: normal TM's and external ear canals both ears Throat: lips, mucosa, and tongue normal; teeth and gums normal Neck: no adenopathy, no carotid bruit, supple, symmetrical, trachea midline and thyroid not enlarged, symmetric, no tenderness/mass/nodules Breasts:  symmetric, no masses or nipple retraction. Back: symmetric, no curvature. ROM normal. No CVA tenderness. Lungs: clear to auscultation bilaterally Heart: regular rate and rhythm, S1, S2 normal, no murmur, click, rub or gallop Abdomen: soft, non-tender; bowel sounds normal; no masses,  no organomegaly Pulses: 2+ and symmetric Skin: Skin color, texture, turgor normal. No rashes or lesions Lymph nodes: Cervical, supraclavicular, and axillary nodes normal.  Assessment and Plan:  Enthesopathy of ankle and tarsus Her prior pain syndrome has resolved. She continues to remain active and take vitamin D daily   Screening for osteoporosis A DEXA scan did show some bone loss. Her lowest T score was -2.0 to hip. We discussed the pros and cons of pharmacotherapy and decided to continue exercise, weightbearing, calcium and vitamin D. Repeat DEXA scan will be planned in 2 years.  Routine general medical examination at a health care facility Breast exam is normal. Pelvic exam was  Deferred as she had a normal pelvic and Pap in 2012.   Updated Medication List Outpatient Encounter Prescriptions as of 03/13/2012  Medication Sig Dispense Refill  . aspirin 81 MG tablet Take 81 mg by mouth daily.      . carisoprodol (SOMA) 350 MG tablet Take 350 mg by mouth 4 (four) times daily as  needed.      . cholecalciferol (VITAMIN D) 1000 UNITS tablet Take 1,000 Units by mouth daily.      . Multiple Vitamin (MULTIVITAMIN) tablet Take 1 tablet by mouth daily.      . SUMAtriptan (IMITREX) 25 MG tablet TAKE 1 TABLET BY MOUTH PRIOR TO HEADACHE  10 tablet  5  . DISCONTD: celecoxib (CELEBREX) 100 MG capsule Take one by mouth as needed.         Orders Placed This Encounter  Procedures  . HM MAMMOGRAPHY  . HM DEXA SCAN  . HM MAMMOGRAPHY  . MM Digital Screening  . Flu vaccine greater than or equal to 3yo preservative free IM  . HM PAP SMEAR  . HM COLONOSCOPY  . HM COLONOSCOPY    Return in about 1 year (around  03/13/2013).

## 2012-03-13 NOTE — Patient Instructions (Addendum)
Your DEXA scan showed some bone loss  But not osteoporosis.   Strive for 1200 mg calcium daily and 1000 units Vit D3,  And add light weights to your exercise regiemn.  We will repeat it in 2 years.  Will will do your PAP smear next year.

## 2012-03-14 ENCOUNTER — Encounter: Payer: Self-pay | Admitting: Internal Medicine

## 2012-03-14 DIAGNOSIS — Z0000 Encounter for general adult medical examination without abnormal findings: Secondary | ICD-10-CM | POA: Insufficient documentation

## 2012-03-14 NOTE — Assessment & Plan Note (Addendum)
A DEXA scan did show some bone loss. Her lowest T score was -2.0 to hip. We discussed the pros and cons of pharmacotherapy and decided to continue exercise, weightbearing, calcium and vitamin D. Repeat DEXA scan will be planned in 2 years.

## 2012-03-14 NOTE — Assessment & Plan Note (Signed)
Her prior pain syndrome has resolved. She continues to remain active and take vitamin D daily

## 2012-03-14 NOTE — Assessment & Plan Note (Addendum)
Breast exam is normal. Pelvic exam was  Deferred as she had a normal pelvic and Pap in 2012.

## 2012-05-07 ENCOUNTER — Encounter: Payer: Self-pay | Admitting: Internal Medicine

## 2012-06-17 ENCOUNTER — Other Ambulatory Visit: Payer: Self-pay | Admitting: Family Medicine

## 2012-06-18 ENCOUNTER — Other Ambulatory Visit: Payer: Self-pay

## 2012-06-18 MED ORDER — SUMATRIPTAN SUCCINATE 25 MG PO TABS: 25.0000 mg | ORAL_TABLET | ORAL | Status: DC | PRN

## 2012-06-18 NOTE — Telephone Encounter (Signed)
Refill request for Imitrex 25 mg. Ok to refill?

## 2012-07-25 ENCOUNTER — Telehealth: Payer: Self-pay | Admitting: *Deleted

## 2012-07-25 DIAGNOSIS — E785 Hyperlipidemia, unspecified: Secondary | ICD-10-CM

## 2012-07-25 DIAGNOSIS — R5383 Other fatigue: Secondary | ICD-10-CM

## 2012-07-25 DIAGNOSIS — Z79899 Other long term (current) drug therapy: Secondary | ICD-10-CM

## 2012-07-25 NOTE — Telephone Encounter (Signed)
Pt is coming in for labs tomorrow (01.23.2014) what labs and dx would you like? Thank you

## 2012-07-25 NOTE — Addendum Note (Signed)
Addended by: Sherlene Shams on: 07/25/2012 05:09 PM   Modules accepted: Orders

## 2012-07-26 ENCOUNTER — Other Ambulatory Visit (INDEPENDENT_AMBULATORY_CARE_PROVIDER_SITE_OTHER): Payer: 59

## 2012-07-26 DIAGNOSIS — Z79899 Other long term (current) drug therapy: Secondary | ICD-10-CM

## 2012-07-26 DIAGNOSIS — R5381 Other malaise: Secondary | ICD-10-CM

## 2012-07-26 DIAGNOSIS — R5383 Other fatigue: Secondary | ICD-10-CM

## 2012-07-26 DIAGNOSIS — E785 Hyperlipidemia, unspecified: Secondary | ICD-10-CM

## 2012-07-26 LAB — CBC WITH DIFFERENTIAL/PLATELET
Basophils Relative: 0.6 % (ref 0.0–3.0)
Eosinophils Absolute: 0.1 10*3/uL (ref 0.0–0.7)
Eosinophils Relative: 1.2 % (ref 0.0–5.0)
Hemoglobin: 13.1 g/dL (ref 12.0–15.0)
Lymphocytes Relative: 20.1 % (ref 12.0–46.0)
MCHC: 33.9 g/dL (ref 30.0–36.0)
MCV: 89.8 fl (ref 78.0–100.0)
Monocytes Absolute: 0.3 10*3/uL (ref 0.1–1.0)
Neutro Abs: 3 10*3/uL (ref 1.4–7.7)
Neutrophils Relative %: 70.6 % (ref 43.0–77.0)
RBC: 4.29 Mil/uL (ref 3.87–5.11)
WBC: 4.3 10*3/uL — ABNORMAL LOW (ref 4.5–10.5)

## 2012-07-26 LAB — COMPREHENSIVE METABOLIC PANEL
AST: 25 U/L (ref 0–37)
Albumin: 4.4 g/dL (ref 3.5–5.2)
Alkaline Phosphatase: 54 U/L (ref 39–117)
BUN: 26 mg/dL — ABNORMAL HIGH (ref 6–23)
Creatinine, Ser: 0.7 mg/dL (ref 0.4–1.2)
Glucose, Bld: 93 mg/dL (ref 70–99)

## 2012-07-26 LAB — LIPID PANEL
Cholesterol: 186 mg/dL (ref 0–200)
LDL Cholesterol: 107 mg/dL — ABNORMAL HIGH (ref 0–99)
Total CHOL/HDL Ratio: 3
Triglycerides: 56 mg/dL (ref 0.0–149.0)
VLDL: 11.2 mg/dL (ref 0.0–40.0)

## 2013-03-18 ENCOUNTER — Ambulatory Visit (INDEPENDENT_AMBULATORY_CARE_PROVIDER_SITE_OTHER): Payer: BC Managed Care – PPO | Admitting: Internal Medicine

## 2013-03-18 ENCOUNTER — Encounter: Payer: Self-pay | Admitting: Internal Medicine

## 2013-03-18 VITALS — BP 128/72 | HR 53 | Temp 98.2°F | Resp 14 | Ht 62.25 in | Wt 106.2 lb

## 2013-03-18 DIAGNOSIS — Z23 Encounter for immunization: Secondary | ICD-10-CM

## 2013-03-18 DIAGNOSIS — Z0000 Encounter for general adult medical examination without abnormal findings: Secondary | ICD-10-CM

## 2013-03-18 MED ORDER — MELOXICAM 15 MG PO TABS: 15.0000 mg | ORAL_TABLET | Freq: Every day | ORAL | Status: DC

## 2013-03-18 NOTE — Assessment & Plan Note (Signed)
Annual comprehensive exam was done including breast but excluding  pelvic and PAP smear. All screenings have been addressed .

## 2013-03-18 NOTE — Progress Notes (Signed)
Patient ID: Cynthia Knight, female   DOB: [birth year], 52 y.o.   MRN: [id number]   Subjective:     Cynthia Knight is a 52 y.o. female and is here for a comprehensive physical exam. The patient reports no problems.  History   Social History  . Marital Status: Married    Spouse Name: N/A    Number of Children: N/A  . Years of Education: N/A   Occupational History  . Not on file.   Social History Main Topics  . Smoking status: Never Smoker   . Smokeless tobacco: Never Used  . Alcohol Use: Yes  . Drug Use: No  . Sexual Activity: Not on file   Other Topics Concern  . Not on file   Social History Narrative  . No narrative on file   Health Maintenance  Topic Date Due  . Influenza Vaccine  02/01/2013  . Pap Smear  04/12/2014  . Mammogram  04/26/2014  . Tetanus/tdap  08/27/2017  . Colonoscopy  04/02/2019    The following portions of the patient's history were reviewed and updated as appropriate: allergies, current medications, past family history, past medical history, past social history, past surgical history and problem list.  Review of Systems A comprehensive review of systems was negative.   Objective:  BP 128/72  Pulse 53  Temp(Src) 98.2 F (36.8 C) (Oral)  Resp 14  Ht 5' 2.25" (1.581 m)  Wt 106 lb 4 oz (48.195 kg)  BMI 19.28 kg/m2  SpO2 96% General Appearance:    Alert, cooperative, no distress, appears stated age  Head:    Normocephalic, without obvious abnormality, atraumatic  Eyes:    PERRL, conjunctiva/corneas clear, EOM's intact, fundi    benign, both eyes  Ears:    Normal TM's and external ear canals, both ears  Nose:   Nares normal, septum midline, mucosa normal, no drainage    or sinus tenderness  Throat:   Lips, mucosa, and tongue normal; teeth and gums normal  Neck:   Supple, symmetrical, trachea midline, no adenopathy;    thyroid:  no enlargement/tenderness/nodules; no carotid   bruit or JVD  Back:     Symmetric, no curvature, ROM normal, no  CVA tenderness  Lungs:     Clear to auscultation bilaterally, respirations unlabored  Chest Wall:    No tenderness or deformity   Heart:    Regular rate and rhythm, S1 and S2 normal, no murmur, rub   or gallop  Breast Exam:    No tenderness, masses, or nipple abnormality  Abdomen:     Soft, non-tender, bowel sounds active all four quadrants,    no masses, no organomegaly     Extremities:   Extremities normal, atraumatic, no cyanosis or edema  Pulses:   2+ and symmetric all extremities  Skin:   Skin color, texture, turgor normal, no rashes or lesions  Lymph nodes:   Cervical, supraclavicular, and axillary nodes normal  Neurologic:   CNII-XII intact, normal strength, sensation and reflexes    throughout     Assessment:   Routine general medical examination at a health care facility Annual comprehensive exam was done including breast but excluding  pelvic and PAP smear. All screenings have been addressed .    Updated Medication List Outpatient Encounter Prescriptions as of 03/18/2013  Medication Sig Dispense Refill  . b complex vitamins tablet Take 1 tablet by mouth daily.      . cholecalciferol (VITAMIN D) 1000 UNITS tablet Take 1,000 Units  by mouth daily.      Marland Kitchen co-enzyme Q-10 30 MG capsule Take 100 mg by mouth daily.      . SUMAtriptan (IMITREX) 25 MG tablet Take 1 tablet (25 mg total) by mouth every 2 (two) hours as needed for migraine. Maximum 2 pill in 24 hours  10 tablet  5  . aspirin 81 MG tablet Take 81 mg by mouth daily.      . carisoprodol (SOMA) 350 MG tablet Take 350 mg by mouth 4 (four) times daily as needed.      . meloxicam (MOBIC) 15 MG tablet Take 1 tablet (15 mg total) by mouth daily.  30 tablet  3  . Multiple Vitamin (MULTIVITAMIN) tablet Take 1 tablet by mouth daily.       No facility-administered encounter medications on file as of 03/18/2013.

## 2013-03-18 NOTE — Patient Instructions (Addendum)
You had your annual  wellness exam today(This was your "annual physical")   We will schedule your mammogram  At [facility] soon.  You are up to date on vaccines  Return in January for your annual bloodwork.  We sent an rx for meloxicam to your pharmacy

## 2013-03-18 NOTE — Progress Notes (Signed)
Patient ID: Cynthia Knight, female   DOB: 02/28/1961, 52 y.o.   MRN: [id number]   Subjective:     Cynthia Knight is a 52 y.o. female and is here for a comprehensive physical exam. The patient reports no problems.  History   Social History  . Marital Status: Married    Spouse Name: N/A    Number of Children: N/A  . Years of Education: N/A   Occupational History  . Not on file.   Social History Main Topics  . Smoking status: Never Smoker   . Smokeless tobacco: Never Used  . Alcohol Use: Yes  . Drug Use: No  . Sexual Activity: Not on file   Other Topics Concern  . Not on file   Social History Narrative  . No narrative on file   Health Maintenance  Topic Date Due  . Influenza Vaccine  02/01/2013  . Pap Smear  04/12/2014  . Mammogram  04/26/2014  . Tetanus/tdap  08/27/2017  . Colonoscopy  04/02/2019    The following portions of the patient's history were reviewed and updated as appropriate: allergies, current medications, past family history, past medical history, past social history, past surgical history and problem list.  Review of Systems A comprehensive review of systems was negative.   Objective:  BP 128/72  Pulse 53  Temp(Src) 98.2 F (36.8 C) (Oral)  Resp 14  Ht 5' 2.25" (1.581 m)  Wt 106 lb 4 oz (48.195 kg)  BMI 19.28 kg/m2  SpO2 96% General Appearance:    Alert, cooperative, no distress, appears stated age  Head:    Normocephalic, without obvious abnormality, atraumatic  Eyes:    PERRL, conjunctiva/corneas clear, EOM's intact, fundi    benign, both eyes  Ears:    Normal TM's and external ear canals, both ears  Nose:   Nares normal, septum midline, mucosa normal, no drainage    or sinus tenderness  Throat:   Lips, mucosa, and tongue normal; teeth and gums normal  Neck:   Supple, symmetrical, trachea midline, no adenopathy;    thyroid:  no enlargement/tenderness/nodules; no carotid   bruit or JVD  Back:     Symmetric, no curvature, ROM normal, no  CVA tenderness  Lungs:     Clear to auscultation bilaterally, respirations unlabored  Chest Wall:    No tenderness or deformity   Heart:    Regular rate and rhythm, S1 and S2 normal, no murmur, rub   or gallop  Breast Exam:    No tenderness, masses, or nipple abnormality  Abdomen:     Soft, non-tender, bowel sounds active all four quadrants,    no masses, no organomegaly     Extremities:   Extremities normal, atraumatic, no cyanosis or edema  Pulses:   2+ and symmetric all extremities  Skin:   Skin color, texture, turgor normal, no rashes or lesions  Lymph nodes:   Cervical, supraclavicular, and axillary nodes normal  Neurologic:   CNII-XII intact, normal strength, sensation and reflexes    throughout     Assessment:   Routine general medical examination at a health care facility Annual comprehensive exam was done including breast but excluding  pelvic and PAP smear. All screenings have been addressed .    Updated Medication List Outpatient Encounter Prescriptions as of 03/18/2013  Medication Sig Dispense Refill  . b complex vitamins tablet Take 1 tablet by mouth daily.      . cholecalciferol (VITAMIN D) 1000 UNITS tablet Take 1,000 Units   by mouth daily.      . co-enzyme Q-10 30 MG capsule Take 100 mg by mouth daily.      . SUMAtriptan (IMITREX) 25 MG tablet Take 1 tablet (25 mg total) by mouth every 2 (two) hours as needed for migraine. Maximum 2 pill in 24 hours  10 tablet  5  . aspirin 81 MG tablet Take 81 mg by mouth daily.      . carisoprodol (SOMA) 350 MG tablet Take 350 mg by mouth 4 (four) times daily as needed.      . meloxicam (MOBIC) 15 MG tablet Take 1 tablet (15 mg total) by mouth daily.  30 tablet  3  . Multiple Vitamin (MULTIVITAMIN) tablet Take 1 tablet by mouth daily.       No facility-administered encounter medications on file as of 03/18/2013.    

## 2013-03-21 LAB — HM PAP SMEAR: HM PAP: NORMAL

## 2013-04-29 LAB — HM MAMMOGRAPHY: HM Mammogram: NORMAL

## 2013-05-21 ENCOUNTER — Encounter: Payer: Self-pay | Admitting: Internal Medicine

## 2013-08-05 ENCOUNTER — Telehealth: Payer: Self-pay | Admitting: Internal Medicine

## 2013-08-05 DIAGNOSIS — E559 Vitamin D deficiency, unspecified: Secondary | ICD-10-CM

## 2013-08-05 DIAGNOSIS — R5381 Other malaise: Secondary | ICD-10-CM

## 2013-08-05 DIAGNOSIS — E785 Hyperlipidemia, unspecified: Secondary | ICD-10-CM

## 2013-08-05 DIAGNOSIS — Z79899 Other long term (current) drug therapy: Secondary | ICD-10-CM

## 2013-08-05 DIAGNOSIS — R5383 Other fatigue: Secondary | ICD-10-CM

## 2013-08-05 NOTE — Telephone Encounter (Signed)
She needs to have a  CMET now  For future refills on the meloxicam.  Last lab was jan 2014. Marland Kitchen  Please have her make appt for that now,  We will worry about the September  labs in August

## 2013-08-05 NOTE — Telephone Encounter (Signed)
Please see message and order labs. 

## 2013-08-05 NOTE — Telephone Encounter (Signed)
Needing labs put in the system for physical in September.

## 2013-08-07 NOTE — Telephone Encounter (Signed)
Sent myChart message

## 2013-08-07 NOTE — Addendum Note (Signed)
Addended by: Crecencio Mc on: 08/07/2013 01:02 PM   Modules accepted: Orders

## 2013-08-29 ENCOUNTER — Other Ambulatory Visit: Payer: BC Managed Care – PPO

## 2013-08-30 ENCOUNTER — Other Ambulatory Visit: Payer: Self-pay | Admitting: Internal Medicine

## 2013-09-09 ENCOUNTER — Other Ambulatory Visit (INDEPENDENT_AMBULATORY_CARE_PROVIDER_SITE_OTHER): Payer: BC Managed Care – PPO

## 2013-09-09 DIAGNOSIS — R5381 Other malaise: Secondary | ICD-10-CM

## 2013-09-09 DIAGNOSIS — Z79899 Other long term (current) drug therapy: Secondary | ICD-10-CM

## 2013-09-09 DIAGNOSIS — R5383 Other fatigue: Secondary | ICD-10-CM

## 2013-09-09 DIAGNOSIS — E559 Vitamin D deficiency, unspecified: Secondary | ICD-10-CM

## 2013-09-09 DIAGNOSIS — E785 Hyperlipidemia, unspecified: Secondary | ICD-10-CM

## 2013-09-09 LAB — CBC WITH DIFFERENTIAL/PLATELET
BASOS ABS: 0 10*3/uL (ref 0.0–0.1)
Basophils Relative: 0.5 % (ref 0.0–3.0)
EOS ABS: 0.1 10*3/uL (ref 0.0–0.7)
Eosinophils Relative: 1.4 % (ref 0.0–5.0)
HCT: 39.5 % (ref 36.0–46.0)
Hemoglobin: 12.9 g/dL (ref 12.0–15.0)
LYMPHS PCT: 25.1 % (ref 12.0–46.0)
Lymphs Abs: 1 10*3/uL (ref 0.7–4.0)
MCHC: 32.8 g/dL (ref 30.0–36.0)
MCV: 92.7 fl (ref 78.0–100.0)
Monocytes Absolute: 0.4 10*3/uL (ref 0.1–1.0)
Monocytes Relative: 9.1 % (ref 3.0–12.0)
NEUTROS PCT: 63.9 % (ref 43.0–77.0)
Neutro Abs: 2.5 10*3/uL (ref 1.4–7.7)
PLATELETS: 210 10*3/uL (ref 150.0–400.0)
RBC: 4.26 Mil/uL (ref 3.87–5.11)
RDW: 12.9 % (ref 11.5–14.6)
WBC: 3.9 10*3/uL — ABNORMAL LOW (ref 4.5–10.5)

## 2013-09-09 LAB — COMPREHENSIVE METABOLIC PANEL
ALBUMIN: 4.4 g/dL (ref 3.5–5.2)
ALT: 21 U/L (ref 0–35)
AST: 27 U/L (ref 0–37)
Alkaline Phosphatase: 45 U/L (ref 39–117)
BUN: 29 mg/dL — ABNORMAL HIGH (ref 6–23)
CALCIUM: 9.1 mg/dL (ref 8.4–10.5)
CHLORIDE: 106 meq/L (ref 96–112)
CO2: 28 mEq/L (ref 19–32)
Creatinine, Ser: 0.6 mg/dL (ref 0.4–1.2)
GFR: 114.85 mL/min (ref >60.00)
Glucose, Bld: 92 mg/dL (ref 70–99)
Potassium: 4.9 mEq/L (ref 3.5–5.1)
Sodium: 141 mEq/L (ref 135–145)
TOTAL PROTEIN: 6.8 g/dL (ref 6.0–8.3)
Total Bilirubin: 0.7 mg/dL (ref 0.3–1.2)

## 2013-09-09 LAB — LIPID PANEL
CHOL/HDL RATIO: 2
Cholesterol: 167 mg/dL (ref 0–200)
HDL: 74 mg/dL (ref >39.00)
LDL Cholesterol: 85 mg/dL (ref 0–99)
TRIGLYCERIDES: 41 mg/dL (ref 0.0–149.0)
VLDL: 8.2 mg/dL (ref 0.0–40.0)

## 2013-09-09 LAB — TSH: TSH: 1.31 u[IU]/mL (ref 0.35–5.50)

## 2013-09-10 ENCOUNTER — Encounter: Payer: Self-pay | Admitting: Internal Medicine

## 2013-09-10 LAB — VITAMIN D 25 HYDROXY (VIT D DEFICIENCY, FRACTURES): Vit D, 25-Hydroxy: 93 ng/mL — ABNORMAL HIGH (ref 30–89)

## 2014-03-17 ENCOUNTER — Telehealth: Payer: Self-pay | Admitting: *Deleted

## 2014-03-17 DIAGNOSIS — Z1159 Encounter for screening for other viral diseases: Secondary | ICD-10-CM

## 2014-03-17 DIAGNOSIS — Z79899 Other long term (current) drug therapy: Secondary | ICD-10-CM

## 2014-03-17 DIAGNOSIS — R5381 Other malaise: Secondary | ICD-10-CM

## 2014-03-17 DIAGNOSIS — E785 Hyperlipidemia, unspecified: Secondary | ICD-10-CM

## 2014-03-17 DIAGNOSIS — R5383 Other fatigue: Secondary | ICD-10-CM

## 2014-03-17 NOTE — Telephone Encounter (Signed)
Pt is coming in tomorrow what labs and dx?  

## 2014-03-18 ENCOUNTER — Other Ambulatory Visit (INDEPENDENT_AMBULATORY_CARE_PROVIDER_SITE_OTHER): Payer: BC Managed Care – PPO

## 2014-03-18 DIAGNOSIS — E785 Hyperlipidemia, unspecified: Secondary | ICD-10-CM

## 2014-03-18 DIAGNOSIS — Z1159 Encounter for screening for other viral diseases: Secondary | ICD-10-CM

## 2014-03-18 DIAGNOSIS — R5381 Other malaise: Secondary | ICD-10-CM

## 2014-03-18 DIAGNOSIS — R5383 Other fatigue: Principal | ICD-10-CM

## 2014-03-18 DIAGNOSIS — Z79899 Other long term (current) drug therapy: Secondary | ICD-10-CM

## 2014-03-18 LAB — LIPID PANEL
CHOL/HDL RATIO: 3
Cholesterol: 160 mg/dL (ref 0–200)
HDL: 63.1 mg/dL (ref >39.00)
LDL Cholesterol: 84 mg/dL (ref 0–99)
NONHDL: 96.9
TRIGLYCERIDES: 66 mg/dL (ref 0.0–149.0)
VLDL: 13.2 mg/dL (ref 0.0–40.0)

## 2014-03-18 LAB — COMPREHENSIVE METABOLIC PANEL
ALT: 23 U/L (ref 0–35)
AST: 28 U/L (ref 0–37)
Albumin: 4.1 g/dL (ref 3.5–5.2)
Alkaline Phosphatase: 50 U/L (ref 39–117)
BILIRUBIN TOTAL: 0.7 mg/dL (ref 0.2–1.2)
BUN: 26 mg/dL — ABNORMAL HIGH (ref 6–23)
CO2: 27 mEq/L (ref 19–32)
Calcium: 9.2 mg/dL (ref 8.4–10.5)
Chloride: 106 mEq/L (ref 96–112)
Creatinine, Ser: 0.7 mg/dL (ref 0.4–1.2)
GFR: 93.81 mL/min (ref >60.00)
Glucose, Bld: 85 mg/dL (ref 70–99)
Potassium: 4.5 mEq/L (ref 3.5–5.1)
SODIUM: 141 meq/L (ref 135–145)
TOTAL PROTEIN: 6.7 g/dL (ref 6.0–8.3)

## 2014-03-18 LAB — TSH: TSH: 2.08 u[IU]/mL (ref 0.35–4.50)

## 2014-03-19 ENCOUNTER — Ambulatory Visit (INDEPENDENT_AMBULATORY_CARE_PROVIDER_SITE_OTHER): Payer: BC Managed Care – PPO

## 2014-03-19 ENCOUNTER — Ambulatory Visit: Payer: BC Managed Care – PPO

## 2014-03-19 DIAGNOSIS — Z23 Encounter for immunization: Secondary | ICD-10-CM

## 2014-03-19 LAB — CBC WITH DIFFERENTIAL/PLATELET
Basophils Absolute: 0 10*3/uL (ref 0.0–0.1)
Basophils Relative: 0.2 % (ref 0.0–3.0)
Eosinophils Absolute: 0.1 10*3/uL (ref 0.0–0.7)
Eosinophils Relative: 1.5 % (ref 0.0–5.0)
HCT: 38.7 % (ref 36.0–46.0)
Hemoglobin: 13.1 g/dL (ref 12.0–15.0)
LYMPHS ABS: 0.7 10*3/uL (ref 0.7–4.0)
Lymphocytes Relative: 18.1 % (ref 12.0–46.0)
MCHC: 33.8 g/dL (ref 30.0–36.0)
MCV: 91.4 fl (ref 78.0–100.0)
MONO ABS: 0.3 10*3/uL (ref 0.1–1.0)
Monocytes Relative: 6.4 % (ref 3.0–12.0)
Neutro Abs: 3 10*3/uL (ref 1.4–7.7)
Neutrophils Relative %: 73.8 % (ref 43.0–77.0)
PLATELETS: 201 10*3/uL (ref 150.0–400.0)
RBC: 4.23 Mil/uL (ref 3.87–5.11)
RDW: 13.4 % (ref 11.5–15.5)
WBC: 4.1 10*3/uL (ref 4.0–10.5)

## 2014-03-19 LAB — HEPATITIS C ANTIBODY: HCV Ab: NEGATIVE

## 2014-03-20 ENCOUNTER — Encounter: Payer: Self-pay | Admitting: Internal Medicine

## 2014-03-21 ENCOUNTER — Ambulatory Visit (INDEPENDENT_AMBULATORY_CARE_PROVIDER_SITE_OTHER): Payer: BC Managed Care – PPO | Admitting: Internal Medicine

## 2014-03-21 ENCOUNTER — Encounter: Payer: Self-pay | Admitting: Internal Medicine

## 2014-03-21 VITALS — BP 106/74 | HR 70 | Temp 97.6°F | Resp 16 | Ht 62.5 in | Wt 108.2 lb

## 2014-03-21 DIAGNOSIS — N939 Abnormal uterine and vaginal bleeding, unspecified: Secondary | ICD-10-CM

## 2014-03-21 DIAGNOSIS — Z0000 Encounter for general adult medical examination without abnormal findings: Secondary | ICD-10-CM

## 2014-03-21 DIAGNOSIS — N926 Irregular menstruation, unspecified: Secondary | ICD-10-CM

## 2014-03-21 DIAGNOSIS — G43019 Migraine without aura, intractable, without status migrainosus: Secondary | ICD-10-CM

## 2014-03-21 MED ORDER — SUMATRIPTAN 20 MG/ACT NA SOLN: 20.0000 mg | NASAL | Status: DC | PRN

## 2014-03-21 NOTE — Progress Notes (Signed)
Patient ID: Cynthia Knight, female   DOB: July 01, 1961, 53 y.o.   MRN: [id number]   Annual exam.    Subjective:     Cynthia Knight is a 53 y.o. female and is here for a comprehensive physical exam. The patient reports no problems.  History   Social History  . Marital Status: Married    Spouse Name: N/A    Number of Children: N/A  . Years of Education: N/A   Occupational History  . Not on file.   Social History Main Topics  . Smoking status: Never Smoker   . Smokeless tobacco: Never Used  . Alcohol Use: Yes  . Drug Use: No  . Sexual Activity: Not on file   Other Topics Concern  . Not on file   Social History Narrative  . No narrative on file   Health Maintenance  Topic Date Due  . Influenza Vaccine  02/02/2015  . Mammogram  04/30/2015  . Pap Smear  03/21/2016  . Colonoscopy  04/02/2019  . Tetanus/tdap  08/18/2022    The following portions of the patient's history were reviewed and updated as appropriate: allergies, current medications, past family history, past medical history, past social history, past surgical history and problem list.  Review of Systems A comprehensive review of systems was negative.   Objective:   BP 106/74  Pulse 70  Temp(Src) 97.6 F (36.4 C) (Oral)  Resp 16  Ht 5' 2.5" (1.588 m)  Wt 108 lb 4 oz (49.102 kg)  BMI 19.47 kg/m2  SpO2 99%  General appearance: alert, cooperative and appears stated age Head: Normocephalic, without obvious abnormality, atraumatic Eyes: conjunctivae/corneas clear. PERRL, EOM's intact. Fundi benign. Ears: normal TM's and external ear canals both ears Nose: Nares normal. Septum midline. Mucosa normal. No drainage or sinus tenderness. Throat: lips, mucosa, and tongue normal; teeth and gums normal Neck: no adenopathy, no carotid bruit, no JVD, supple, symmetrical, trachea midline and thyroid not enlarged, symmetric, no tenderness/mass/nodules Lungs: clear to auscultation bilaterally Breasts: normal  appearance, no masses or tenderness Heart: regular rate and rhythm, S1, S2 normal, no murmur, click, rub or gallop Abdomen: soft, non-tender; bowel sounds normal; no masses,  no organomegaly Extremities: extremities normal, atraumatic, no cyanosis or edema Pulses: 2+ and symmetric Skin: Skin color, texture, turgor normal. No rashes or lesions Neurologic: Alert and oriented X 3, normal strength and tone. Normal symmetric reflexes. Normal coordination and gait.   .    Assessment and Plan:   Migraine headache without aura Infrequent,  Controlled with imitrex.  Nasal spray formula offered as a trial   Menopause We discussed her current symptoms and the level of discomfort she was having with them. She is not interested in starting hormone therapy as her symptoms are not that severe.  Discussed vaginal estrogen , which was offered to her by her gynecologist but not accepted.    Encounter for preventive health examination Annual comprehensive exam was done including breast, excluding pelvic and PAP smear. All screenings have been addressed and a printed health maintenance schedule was given to patient.     Updated Medication List Outpatient Encounter Prescriptions as of 03/21/2014  Medication Sig  . b complex vitamins tablet Take 1 tablet by mouth daily.  . carisoprodol (SOMA) 350 MG tablet Take 350 mg by mouth 4 (four) times daily as needed.  . cholecalciferol (VITAMIN D) 1000 UNITS tablet Take 1,000 Units by mouth daily.  Marland Kitchen co-enzyme Q-10 30 MG capsule Take 100 mg by mouth  daily.  . meloxicam (MOBIC) 15 MG tablet Take 1 tablet (15 mg total) by mouth daily.  . Multiple Vitamin (MULTIVITAMIN) tablet Take 1 tablet by mouth daily.  . SUMAtriptan (IMITREX) 25 MG tablet TAKE ONE TABLET Q2 AS NEEDED FOR MIGRAINE MAX OF 2 PER 24 HRS  . aspirin 81 MG tablet Take 81 mg by mouth daily.  . SUMAtriptan (IMITREX) 20 MG/ACT nasal spray Place 1 spray (20 mg total) into the nose every 2 (two) hours as  needed for migraine or headache. May repeat in 2 hours if headache persists or recurs.

## 2014-03-21 NOTE — Progress Notes (Signed)
Pre-visit discussion using our clinic review tool. No additional management support is needed unless otherwise documented below in the visit note.  

## 2014-03-21 NOTE — Patient Instructions (Addendum)
I recommend getting the majority of your calcium and Vitamin D  through diet rather than supplements given the recent association of calcium supplements with increased coronary artery calcium scores (You need 1200 mg daily )   Unsweetened almond/coconut milk  is a great low calorie low carb, cholesterol free  way to increase your dietary calcium and vitamin D.  Try the blue Diamond  Brand  Health Maintenance Adopting a healthy lifestyle and getting preventive care can go a long way to promote health and wellness. Talk with your health care provider about what schedule of regular examinations is right for you. This is a good chance for you to check in with your provider about disease prevention and staying healthy. In between checkups, there are plenty of things you can do on your own. Experts have done a lot of research about which lifestyle changes and preventive measures are most likely to keep you healthy. Ask your health care provider for more information. WEIGHT AND DIET  Eat a healthy diet  Be sure to include plenty of vegetables, fruits, low-fat dairy products, and lean protein.  Do not eat a lot of foods high in solid fats, added sugars, or salt.  Get regular exercise. This is one of the most important things you can do for your health.  Most adults should exercise for at least 150 minutes each week. The exercise should increase your heart rate and make you sweat (moderate-intensity exercise).  Most adults should also do strengthening exercises at least twice a week. This is in addition to the moderate-intensity exercise.  Maintain a healthy weight  Body mass index (BMI) is a measurement that can be used to identify possible weight problems. It estimates body fat based on height and weight. Your health care provider can help determine your BMI and help you achieve or maintain a healthy weight.  For females 20 years of age and older:   A BMI below 18.5 is considered  underweight.  A BMI of 18.5 to 24.9 is normal.  A BMI of 25 to 29.9 is considered overweight.  A BMI of 30 and above is considered obese.  Watch levels of cholesterol and blood lipids  You should start having your blood tested for lipids and cholesterol at 55 years of age, then have this test every 5 years.  You may need to have your cholesterol levels checked more often if:  Your lipid or cholesterol levels are high.  You are older than 55 years of age.  You are at high risk for heart disease.  CANCER SCREENING   Lung Cancer  Lung cancer screening is recommended for adults 55-80 years old who are at high risk for lung cancer because of a history of smoking.  A yearly low-dose CT scan of the lungs is recommended for people who:  Currently smoke.  Have quit within the past 15 years.  Have at least a 30-pack-year history of smoking. A pack year is smoking an average of one pack of cigarettes a day for 1 year.  Yearly screening should continue until it has been 15 years since you quit.  Yearly screening should stop if you develop a health problem that would prevent you from having lung cancer treatment.  Breast Cancer  Practice breast self-awareness. This means understanding how your breasts normally appear and feel.  It also means doing regular breast self-exams. Let your health care provider know about any changes, no matter how small.  If you are in your 20s   or 13s, you should have a clinical breast exam (CBE) by a health care provider every 1-3 years as part of a regular health exam.  If you are 16 or older, have a CBE every year. Also consider having a breast X-ray (mammogram) every year.  If you have a family history of breast cancer, talk to your health care provider about genetic screening.  If you are at high risk for breast cancer, talk to your health care provider about having an MRI and a mammogram every year.  Breast cancer gene (BRCA) assessment is  recommended for women who have family members with BRCA-related cancers. BRCA-related cancers include:  Breast.  Ovarian.  Tubal.  Peritoneal cancers.  Results of the assessment will determine the need for genetic counseling and BRCA1 and BRCA2 testing. Cervical Cancer Routine pelvic examinations to screen for cervical cancer are no longer recommended for nonpregnant women who are considered low risk for cancer of the pelvic organs (ovaries, uterus, and vagina) and who do not have symptoms. A pelvic examination may be necessary if you have symptoms including those associated with pelvic infections. Ask your health care provider if a screening pelvic exam is right for you.   The Pap test is the screening test for cervical cancer for women who are considered at risk.  If you had a hysterectomy for a problem that was not cancer or a condition that could lead to cancer, then you no longer need Pap tests.  If you are older than 65 years, and you have had normal Pap tests for the past 10 years, you no longer need to have Pap tests.  If you have had past treatment for cervical cancer or a condition that could lead to cancer, you need Pap tests and screening for cancer for at least 20 years after your treatment.  If you no longer get a Pap test, assess your risk factors if they change (such as having a new sexual partner). This can affect whether you should start being screened again.  Some women have medical problems that increase their chance of getting cervical cancer. If this is the case for you, your health care provider may recommend more frequent screening and Pap tests.  The human papillomavirus (HPV) test is another test that may be used for cervical cancer screening. The HPV test looks for the virus that can cause cell changes in the cervix. The cells collected during the Pap test can be tested for HPV.  The HPV test can be used to screen women 51 years of age and older. Getting tested  for HPV can extend the interval between normal Pap tests from three to five years.  An HPV test also should be used to screen women of any age who have unclear Pap test results.  After 55 years of age, women should have HPV testing as often as Pap tests.  Colorectal Cancer  This type of cancer can be detected and often prevented.  Routine colorectal cancer screening usually begins at 55 years of age and continues through 55 years of age.  Your health care provider may recommend screening at an earlier age if you have risk factors for colon cancer.  Your health care provider may also recommend using home test kits to check for hidden blood in the stool.  A small camera at the end of a tube can be used to examine your colon directly (sigmoidoscopy or colonoscopy). This is done to check for the earliest forms of colorectal cancer.  Routine screening usually begins at age 100.  Direct examination of the colon should be repeated every 5-10 years through 55 years of age. However, you may need to be screened more often if early forms of precancerous polyps or small growths are found. Skin Cancer  Check your skin from head to toe regularly.  Tell your health care provider about any new moles or changes in moles, especially if there is a change in a mole's shape or color.  Also tell your health care provider if you have a mole that is larger than the size of a pencil eraser.  Always use sunscreen. Apply sunscreen liberally and repeatedly throughout the day.  Protect yourself by wearing long sleeves, pants, a wide-brimmed hat, and sunglasses whenever you are outside. HEART DISEASE, DIABETES, AND HIGH BLOOD PRESSURE   Have your blood pressure checked at least every 1-2 years. High blood pressure causes heart disease and increases the risk of stroke.  If you are between 61 years and 46 years old, ask your health care provider if you should take aspirin to prevent strokes.  Have regular  diabetes screenings. This involves taking a blood sample to check your fasting blood sugar level.  If you are at a normal weight and have a low risk for diabetes, have this test once every three years after 55 years of age.  If you are overweight and have a high risk for diabetes, consider being tested at a younger age or more often. PREVENTING INFECTION  Hepatitis B  If you have a higher risk for hepatitis B, you should be screened for this virus. You are considered at high risk for hepatitis B if:  You were born in a country where hepatitis B is common. Ask your health care provider which countries are considered high risk.  Your parents were born in a high-risk country, and you have not been immunized against hepatitis B (hepatitis B vaccine).  You have HIV or AIDS.  You use needles to inject street drugs.  You live with someone who has hepatitis B.  You have had sex with someone who has hepatitis B.  You get hemodialysis treatment.  You take certain medicines for conditions, including cancer, organ transplantation, and autoimmune conditions. Hepatitis C  Blood testing is recommended for:  Everyone born from [birth year] through 1967.  Anyone with known risk factors for hepatitis C. Sexually transmitted infections (STIs)  You should be screened for sexually transmitted infections (STIs) including gonorrhea and chlamydia if:  You are sexually active and are younger than 54 years of age.  You are older than 55 years of age and your health care provider tells you that you are at risk for this type of infection.  Your sexual activity has changed since you were last screened and you are at an increased risk for chlamydia or gonorrhea. Ask your health care provider if you are at risk.  If you do not have HIV, but are at risk, it may be recommended that you take a prescription medicine daily to prevent HIV infection. This is called pre-exposure prophylaxis (PrEP). You are considered at  risk if:  You are sexually active and do not regularly use condoms or know the HIV status of your partner(s).  You take drugs by injection.  You are sexually active with a partner who has HIV. Talk with your health care provider about whether you are at high risk of being infected with HIV. If you choose to begin PrEP, you should first  be tested for HIV. You should then be tested every 3 months for as long as you are taking PrEP.  PREGNANCY   If you are premenopausal and you may become pregnant, ask your health care provider about preconception counseling.  If you may become pregnant, take 400 to 800 micrograms (mcg) of folic acid every day.  If you want to prevent pregnancy, talk to your health care provider about birth control (contraception). OSTEOPOROSIS AND MENOPAUSE   Osteoporosis is a disease in which the bones lose minerals and strength with aging. This can result in serious bone fractures. Your risk for osteoporosis can be identified using a bone density scan.  If you are 17 years of age or older, or if you are at risk for osteoporosis and fractures, ask your health care provider if you should be screened.  Ask your health care provider whether you should take a calcium or vitamin D supplement to lower your risk for osteoporosis.  Menopause may have certain physical symptoms and risks.  Hormone replacement therapy may reduce some of these symptoms and risks. Talk to your health care provider about whether hormone replacement therapy is right for you.  HOME CARE INSTRUCTIONS   Schedule regular health, dental, and eye exams.  Stay current with your immunizations.   Do not use any tobacco products including cigarettes, chewing tobacco, or electronic cigarettes.  If you are pregnant, do not drink alcohol.  If you are breastfeeding, limit how much and how often you drink alcohol.  Limit alcohol intake to no more than 1 drink per day for nonpregnant women. One drink equals  12 ounces of beer, 5 ounces of wine, or 1 ounces of hard liquor.  Do not use street drugs.  Do not share needles.  Ask your health care provider for help if you need support or information about quitting drugs.  Tell your health care provider if you often feel depressed.  Tell your health care provider if you have ever been abused or do not feel safe at home. Document Released: 01/03/2011 Document Revised: 11/04/2013 Document Reviewed: 05/22/2013 [facility] Patient Information 2015 [place], [place]. This information is not intended to replace advice given to you by your health care provider. Make sure you discuss any questions you have with your health care provider.

## 2014-03-23 NOTE — Assessment & Plan Note (Signed)
Infrequent,  Controlled with imitrex.  Nasal spray formula offered as a trial

## 2014-03-23 NOTE — Assessment & Plan Note (Signed)
Annual comprehensive exam was done including breast, excluding pelvic and PAP smear. All screenings have been addressed and a printed health maintenance schedule was given to patient.   

## 2014-03-23 NOTE — Assessment & Plan Note (Addendum)
We discussed her current symptoms and the level of discomfort she was having with them. She is not interested in starting hormone therapy as her symptoms are not that severe.  Discussed vaginal estrogen , which was offered to her by her gynecologist but not accepted.

## 2014-03-25 ENCOUNTER — Telehealth: Payer: Self-pay | Admitting: Internal Medicine

## 2014-03-25 DIAGNOSIS — Z1239 Encounter for other screening for malignant neoplasm of breast: Secondary | ICD-10-CM

## 2014-03-25 NOTE — Telephone Encounter (Signed)
Patient received a denial letter from [facility] for coverage of 3 D mammograms.  Please find out if patient pays the difference between a regular and a 3d  Mammogram at [place] will they do it

## 2014-03-26 NOTE — Telephone Encounter (Signed)
The patient is scheduled at [facility] for a 3D in late october.  She will only have to pay a $55.00 difference to have the 3D.

## 2014-04-23 ENCOUNTER — Other Ambulatory Visit: Payer: Self-pay | Admitting: Internal Medicine

## 2014-04-30 LAB — HM MAMMOGRAPHY: HM Mammogram: NEGATIVE

## 2014-05-02 ENCOUNTER — Encounter: Payer: Self-pay | Admitting: *Deleted

## 2014-05-15 ENCOUNTER — Encounter: Payer: Self-pay | Admitting: Internal Medicine

## 2014-08-22 ENCOUNTER — Telehealth: Payer: Self-pay | Admitting: *Deleted

## 2014-08-22 NOTE — Telephone Encounter (Signed)
Spoke with pt, advised she would need to be seen.  States she would call back to schedule.

## 2014-08-22 NOTE — Telephone Encounter (Signed)
Fax from pharmacy requesting Carisoprodol 350 mg.  Last refill 2.8.13 by another MD, never Rx'd by Dr Derrel Nip.  Pt only uses medication occasionally for muscle spasms.  Last OV 9.18.15.  Please advise in Dr Lupita Dawn absence.

## 2014-08-22 NOTE — Telephone Encounter (Signed)
I don't see that she has a signed controlled substance contract or UDS on file. She will need to wait until Dr. Derrel Nip returns for refill.

## 2014-08-26 ENCOUNTER — Other Ambulatory Visit: Payer: Self-pay | Admitting: *Deleted

## 2014-08-26 NOTE — Telephone Encounter (Signed)
Okay to refill? Pt last seen on 03/21/14 (no future appt). Please advise  Pharmacy note: Last fill was 08/12/11-so she uses it only occasionally.

## 2014-08-28 MED ORDER — CARISOPRODOL 350 MG PO TABS: 350.0000 mg | ORAL_TABLET | Freq: Three times a day (TID) | ORAL | Status: DC | PRN

## 2014-08-28 NOTE — Telephone Encounter (Signed)
Ok to refill,  Authorized in epic 

## 2014-11-24 ENCOUNTER — Other Ambulatory Visit: Payer: Self-pay | Admitting: Internal Medicine

## 2014-11-24 NOTE — Telephone Encounter (Signed)
Last Ov 9.18.15, pt scheduled appoint 5.24.16.  Please advise refill

## 2014-11-25 ENCOUNTER — Telehealth: Payer: Self-pay | Admitting: Internal Medicine

## 2014-11-25 ENCOUNTER — Ambulatory Visit: Payer: Self-pay | Admitting: Internal Medicine

## 2014-11-25 DIAGNOSIS — Z0289 Encounter for other administrative examinations: Secondary | ICD-10-CM

## 2014-11-25 NOTE — Telephone Encounter (Signed)
FYI

## 2014-11-25 NOTE — Telephone Encounter (Signed)
IF THE SLOT CAN BE FILLED SHE WILL NOT BE CHARGED. OTHERWSIE .Marland KitchenMarland Kitchen

## 2014-11-25 NOTE — Telephone Encounter (Signed)
Pt called to cancel appointment today due to having lunch with clients. She will call later to reschedule her appointment. MAF

## 2014-11-25 NOTE — Telephone Encounter (Signed)
30 day supply authorized

## 2015-03-16 ENCOUNTER — Other Ambulatory Visit: Payer: Self-pay | Admitting: Internal Medicine

## 2015-03-17 NOTE — Telephone Encounter (Signed)
Last OV 9.18.15.  Please advise refill

## 2015-03-19 NOTE — Telephone Encounter (Signed)
Ok to refill,  Refill sent  

## 2015-03-23 ENCOUNTER — Telehealth: Payer: Self-pay | Admitting: *Deleted

## 2015-03-23 ENCOUNTER — Other Ambulatory Visit (INDEPENDENT_AMBULATORY_CARE_PROVIDER_SITE_OTHER): Payer: BLUE CROSS/BLUE SHIELD

## 2015-03-23 DIAGNOSIS — E785 Hyperlipidemia, unspecified: Secondary | ICD-10-CM

## 2015-03-23 DIAGNOSIS — D72819 Decreased white blood cell count, unspecified: Secondary | ICD-10-CM

## 2015-03-23 DIAGNOSIS — E559 Vitamin D deficiency, unspecified: Secondary | ICD-10-CM

## 2015-03-23 DIAGNOSIS — Z113 Encounter for screening for infections with a predominantly sexual mode of transmission: Secondary | ICD-10-CM

## 2015-03-23 DIAGNOSIS — R5383 Other fatigue: Secondary | ICD-10-CM

## 2015-03-23 LAB — LIPID PANEL
CHOL/HDL RATIO: 2
Cholesterol: 169 mg/dL (ref 0–200)
HDL: 74.5 mg/dL (ref >39.00)
LDL Cholesterol: 79 mg/dL (ref 0–99)
NONHDL: 94.48
Triglycerides: 79 mg/dL (ref 0.0–149.0)
VLDL: 15.8 mg/dL (ref 0.0–40.0)

## 2015-03-23 LAB — CBC WITH DIFFERENTIAL/PLATELET
BASOS ABS: 0 10*3/uL (ref 0.0–0.1)
Basophils Relative: 0.5 % (ref 0.0–3.0)
EOS ABS: 0.1 10*3/uL (ref 0.0–0.7)
Eosinophils Relative: 2 % (ref 0.0–5.0)
HCT: 40 % (ref 36.0–46.0)
Hemoglobin: 13.2 g/dL (ref 12.0–15.0)
LYMPHS ABS: 0.9 10*3/uL (ref 0.7–4.0)
LYMPHS PCT: 21.9 % (ref 12.0–46.0)
MCHC: 32.9 g/dL (ref 30.0–36.0)
MCV: 92 fl (ref 78.0–100.0)
MONOS PCT: 7.9 % (ref 3.0–12.0)
Monocytes Absolute: 0.3 10*3/uL (ref 0.1–1.0)
NEUTROS PCT: 67.7 % (ref 43.0–77.0)
Neutro Abs: 2.8 10*3/uL (ref 1.4–7.7)
PLATELETS: 203 10*3/uL (ref 150.0–400.0)
RBC: 4.35 Mil/uL (ref 3.87–5.11)
RDW: 13.7 % (ref 11.5–15.5)
WBC: 4.1 10*3/uL (ref 4.0–10.5)

## 2015-03-23 LAB — COMPREHENSIVE METABOLIC PANEL
ALBUMIN: 4.3 g/dL (ref 3.5–5.2)
ALK PHOS: 49 U/L (ref 39–117)
ALT: 20 U/L (ref 0–35)
AST: 22 U/L (ref 0–37)
BUN: 26 mg/dL — AB (ref 6–23)
CO2: 29 mEq/L (ref 19–32)
CREATININE: 0.63 mg/dL (ref 0.40–1.20)
Calcium: 8.9 mg/dL (ref 8.4–10.5)
Chloride: 103 mEq/L (ref 96–112)
GFR: 103.81 mL/min (ref >60.00)
GLUCOSE: 93 mg/dL (ref 70–99)
Potassium: 4.6 mEq/L (ref 3.5–5.1)
SODIUM: 138 meq/L (ref 135–145)
TOTAL PROTEIN: 6.5 g/dL (ref 6.0–8.3)
Total Bilirubin: 0.5 mg/dL (ref 0.2–1.2)

## 2015-03-23 LAB — TSH: TSH: 1.76 u[IU]/mL (ref 0.35–4.50)

## 2015-03-23 LAB — VITAMIN D 25 HYDROXY (VIT D DEFICIENCY, FRACTURES): VITD: 44.24 ng/mL (ref 30.00–100.00)

## 2015-03-23 NOTE — Telephone Encounter (Signed)
Labs and dx?  

## 2015-03-24 ENCOUNTER — Encounter: Payer: Self-pay | Admitting: Internal Medicine

## 2015-03-24 LAB — HIV ANTIBODY (ROUTINE TESTING W REFLEX): HIV 1&2 Ab, 4th Generation: NONREACTIVE

## 2015-03-25 ENCOUNTER — Ambulatory Visit (INDEPENDENT_AMBULATORY_CARE_PROVIDER_SITE_OTHER): Payer: BLUE CROSS/BLUE SHIELD | Admitting: Internal Medicine

## 2015-03-25 ENCOUNTER — Encounter: Payer: Self-pay | Admitting: Internal Medicine

## 2015-03-25 VITALS — BP 108/70 | HR 68 | Temp 97.7°F | Resp 12 | Ht 62.5 in | Wt 109.0 lb

## 2015-03-25 DIAGNOSIS — Z0000 Encounter for general adult medical examination without abnormal findings: Secondary | ICD-10-CM | POA: Diagnosis not present

## 2015-03-25 DIAGNOSIS — M8580 Other specified disorders of bone density and structure, unspecified site: Secondary | ICD-10-CM

## 2015-03-25 DIAGNOSIS — Z23 Encounter for immunization: Secondary | ICD-10-CM | POA: Diagnosis not present

## 2015-03-25 DIAGNOSIS — Z1239 Encounter for other screening for malignant neoplasm of breast: Secondary | ICD-10-CM

## 2015-03-25 NOTE — Progress Notes (Signed)
Patient ID: Cynthia Knight, female    DOB: 01-08-1961  Age: 54 y.o. MRN: [id number]  The patient is here for annual  wellness examination and management of other chronic and acute problems.   Mammogram normal [facility] Oct 2015.  Due now  PAP smear normal 2014,  fone by Guy Franco in [place] due on October  Foot x rays of left foot ( normal)  due to pain done by [facility],   Colonoscopy done by Paul  Oh 2010   The risk factors are reflected in the social history.  The roster of all physicians providing medical care to patient - is listed in the Snapshot section of the chart.  Home safety : The patient has smoke detectors in the home. They wear seatbelts.  There are no firearms at home. There is no violence in the home.   There is no risks for hepatitis, STDs or HIV. There is no   history of blood transfusion. They have no travel history to infectious disease endemic areas of the world.  The patient has seen their dentist in the last six month. They have seen their eye doctor in the last year. They admit to slight hearing difficulty with regard to whispered voices and some television programs.  They have deferred audiologic testing in the last year.  They do not  have excessive sun exposure. Discussed the need for sun protection: hats, long sleeves and use of sunscreen if there is significant sun exposure.   Diet: the importance of a healthy diet is discussed. They do have a healthy diet.  The benefits of regular aerobic exercise were discussed. She exercises 5 times per week ,  60 minutes.   Depression screen: there are no signs or vegative symptoms of depression- irritability, change in appetite, anhedonia, sadness/tearfullness.    The following portions of the patient's history were reviewed and updated as appropriate: allergies, current medications, past family history, past medical history,  past surgical history, past social history  and problem list.  Visual acuity was not assessed per  patient preference since she has regular follow up with her ophthalmologist. Hearing and body mass index were assessed and reviewed.   During the course of the visit the patient was educated and counseled about appropriate screening and preventive services including : fall prevention , diabetes screening, nutrition counseling, colorectal cancer screening, and recommended immunizations.    CC: The primary encounter diagnosis was Breast cancer screening. Diagnoses of Encounter for preventive health examination and Osteopenia determined by x-ray were also pertinent to this visit.  History Scotlynn has a past medical history of Migraine headache.   She has past surgical history that includes Breast surgery (1980).   Her family history includes Cancer (age of onset: 23) in her father; Cancer (age of onset: 76) in her mother; Early death in her paternal grandfather and paternal grandmother; Heart disease in her paternal grandfather and paternal grandmother; Hyperlipidemia in her father; Hypertension in her brother; Osteoporosis in her mother.She reports that she has never smoked. She has never used smokeless tobacco. She reports that she drinks alcohol. She reports that she does not use illicit drugs.  Outpatient Prescriptions Prior to Visit  Medication Sig Dispense Refill  . carisoprodol (SOMA) 350 MG tablet Take 1 tablet (350 mg total) by mouth 3 (three) times daily as needed for muscle spasms. 60 tablet 0  . meloxicam (MOBIC) 15 MG tablet TAKE ONE TABLET EVERY DAY 30 tablet 5  . SUMAtriptan (IMITREX) 25 MG tablet  TAKE ONE TABLET BY MOUTH Q2 AS NEEDED FOR MIGRAINE  MAX OF 2 TABLETS PER 24 HOUR 12 tablet 6  . SUMAtriptan (IMITREX) 20 MG/ACT nasal spray Place 1 spray (20 mg total) into the nose every 2 (two) hours as needed for migraine or headache. May repeat in 2 hours if headache persists or recurs. 1 Inhaler 0  . aspirin 81 MG tablet Take 81 mg by mouth daily.    . cholecalciferol (VITAMIN D) 1000  UNITS tablet Take 1,000 Units by mouth daily.    Marland Kitchen b complex vitamins tablet Take 1 tablet by mouth daily.    Marland Kitchen co-enzyme Q-10 30 MG capsule Take 100 mg by mouth daily.    . Multiple Vitamin (MULTIVITAMIN) tablet Take 1 tablet by mouth daily.     No facility-administered medications prior to visit.    Review of Systems   Patient denies headache, fevers, malaise, unintentional weight loss, skin rash, eye pain, sinus congestion and sinus pain, sore throat, dysphagia,  hemoptysis , cough, dyspnea, wheezing, chest pain, palpitations, orthopnea, edema, abdominal pain, nausea, melena, diarrhea, constipation, flank pain, dysuria, hematuria, urinary  Frequency, nocturia, numbness, tingling, seizures,  Focal weakness, Loss of consciousness,  Tremor, insomnia, depression, anxiety, and suicidal ideation.      Objective:  BP 108/70 mmHg  Pulse 68  Temp(Src) 97.7 F (36.5 C) (Oral)  Resp 12  Ht 5' 2.5" (1.588 m)  Wt 109 lb (49.442 kg)  BMI 19.61 kg/m2  SpO2 99%  Physical Exam   General appearance: alert, cooperative and appears stated age Head: Normocephalic, without obvious abnormality, atraumatic Eyes: conjunctivae/corneas clear. PERRL, EOM's intact. Fundi benign. Ears: normal TM's and external ear canals both ears Nose: Nares normal. Septum midline. Mucosa normal. No drainage or sinus tenderness. Throat: lips, mucosa, and tongue normal; teeth and gums normal Neck: no adenopathy, no carotid bruit, no JVD, supple, symmetrical, trachea midline and thyroid not enlarged, symmetric, no tenderness/mass/nodules Lungs: clear to auscultation bilaterally Breasts: normal appearance, no masses or tenderness Heart: regular rate and rhythm, S1, S2 normal, no murmur, click, rub or gallop Abdomen: soft, non-tender; bowel sounds normal; no masses,  no organomegaly Extremities: extremities normal, atraumatic, no cyanosis or edema Pulses: 2+ and symmetric Skin: Skin color, texture, turgor normal. No rashes  or lesions Neurologic: Alert and oriented X 3, normal strength and tone. Normal symmetric reflexes. Normal coordination and gait.    Assessment & Plan:   Problem List Items Addressed This Visit      Unprioritized   Osteopenia determined by x-ray    Prior DEXA scan in 2013  did show some bone loss. Her lowest T score was -2.0 to hip. We discussed the pros and cons of pharmacotherapy and decided to continue exercise, weightbearing, calcium and vitamin D. Repeat DEXA scan is due in 2018       Encounter for preventive health examination    Annual wellness  exam was done as well as a comprehensive physical exam  .  During the course of the visit the patient was educated and counseled about appropriate screening and preventive services and screenings were brought up to date for cervical and breast cancer .   fasting labs to provide samples for diabetes screening and lipid analysis with projected  10 year  risk for CAD was done.  nutrition counseling, skin cancer screening has been recommended, along with review of the age appropriate recommended immunizations.  Printed recommendations for health maintenance screenings was given.   Lab Results  Component  Value Date   CHOL 169 03/23/2015   HDL 74.50 03/23/2015   LDLCALC 79 03/23/2015   TRIG 79.0 03/23/2015   CHOLHDL 2 03/23/2015           Other Visit Diagnoses    Breast cancer screening    -  Primary    Relevant Orders    MM DIGITAL SCREENING BILATERAL       I have discontinued Ms. Bankert's multivitamin, b complex vitamins, and co-enzyme Q-10. I am also having her maintain her cholecalciferol, aspirin, SUMAtriptan, carisoprodol, and meloxicam.  No orders of the defined types were placed in this encounter.    Medications Discontinued During This Encounter  Medication Reason  . b complex vitamins tablet   . co-enzyme Q-10 30 MG capsule   . Multiple Vitamin (MULTIVITAMIN) tablet Error  . SUMAtriptan (IMITREX) 20 MG/ACT nasal  spray Cost of medication    Follow-up: No Follow-up on file.   Crecencio Mc, MD

## 2015-03-25 NOTE — Patient Instructions (Signed)

## 2015-03-25 NOTE — Progress Notes (Signed)
Pre-visit discussion using our clinic review tool. No additional management support is needed unless otherwise documented below in the visit note.  

## 2015-03-28 NOTE — Assessment & Plan Note (Signed)
Annual wellness  exam was done as well as a comprehensive physical exam  .  During the course of the visit the patient was educated and counseled about appropriate screening and preventive services and screenings were brought up to date for cervical and breast cancer .   fasting labs to provide samples for diabetes screening and lipid analysis with projected  10 year  risk for CAD was done.  nutrition counseling, skin cancer screening has been recommended, along with review of the age appropriate recommended immunizations.  Printed recommendations for health maintenance screenings was given.   Lab Results  Component Value Date   CHOL 169 03/23/2015   HDL 74.50 03/23/2015   LDLCALC 79 03/23/2015   TRIG 79.0 03/23/2015   CHOLHDL 2 03/23/2015

## 2015-03-28 NOTE — Assessment & Plan Note (Addendum)
Prior DEXA scan in 2013  did show some bone loss. Her lowest T score was -2.0 to hip. We discussed the pros and cons of pharmacotherapy and decided to continue exercise, weightbearing, calcium and vitamin D. Repeat DEXA scan is due in 2018

## 2015-03-28 NOTE — Addendum Note (Signed)
Addended by: Crecencio Mc on: 03/28/2015 04:03 PM   Modules accepted: Miquel Dunn

## 2015-05-04 LAB — HM MAMMOGRAPHY: HM MAMMO: NEGATIVE

## 2015-05-05 ENCOUNTER — Encounter: Payer: Self-pay | Admitting: Internal Medicine

## 2015-06-02 ENCOUNTER — Other Ambulatory Visit: Payer: Self-pay | Admitting: Internal Medicine

## 2015-08-12 ENCOUNTER — Other Ambulatory Visit: Payer: Self-pay | Admitting: Internal Medicine

## 2015-08-12 NOTE — Telephone Encounter (Signed)
Ok to refill,  Refill sent  

## 2015-08-12 NOTE — Telephone Encounter (Signed)
Pt. Is requesting a refill on Imitrex, Pt last OV 03/25/15, last filled 06/03/15 #12tabs with 1 refill, Please advise okay to fill, thanks

## 2015-10-19 ENCOUNTER — Other Ambulatory Visit: Payer: Self-pay

## 2015-10-19 ENCOUNTER — Other Ambulatory Visit: Payer: Self-pay | Admitting: Internal Medicine

## 2015-10-19 MED ORDER — CARISOPRODOL 350 MG PO TABS: 350.0000 mg | ORAL_TABLET | Freq: Three times a day (TID) | ORAL | Status: DC | PRN

## 2015-10-19 NOTE — Telephone Encounter (Signed)
Refilled on 08/28/2014. Please advise?

## 2015-10-19 NOTE — Telephone Encounter (Signed)
Refill authorized.  Please call in .  Thanks

## 2015-10-20 NOTE — Telephone Encounter (Signed)
Verbal was given to pharmacy  ?

## 2015-11-12 ENCOUNTER — Telehealth: Payer: Self-pay | Admitting: Internal Medicine

## 2015-11-12 NOTE — Telephone Encounter (Signed)
letter re: leukopenia for long term insurance premium adjustment printed

## 2015-11-12 NOTE — Telephone Encounter (Signed)
Faxed letter as patient requested and placed original at front des for patient pick up.

## 2016-04-01 ENCOUNTER — Encounter: Payer: BLUE CROSS/BLUE SHIELD | Admitting: Internal Medicine

## 2016-04-06 ENCOUNTER — Telehealth: Payer: Self-pay | Admitting: Internal Medicine

## 2016-04-06 DIAGNOSIS — E785 Hyperlipidemia, unspecified: Secondary | ICD-10-CM

## 2016-04-06 DIAGNOSIS — E559 Vitamin D deficiency, unspecified: Secondary | ICD-10-CM

## 2016-04-06 DIAGNOSIS — R5383 Other fatigue: Secondary | ICD-10-CM

## 2016-04-06 NOTE — Telephone Encounter (Signed)
Ok to order labs? 

## 2016-04-06 NOTE — Telephone Encounter (Signed)
Pt called and stated that she would like blood work to be done before her physical on 10/23.  Need orders. Please advise, thank you!  Call @ [phone]

## 2016-04-07 NOTE — Telephone Encounter (Signed)
Fasting labs ordered

## 2016-04-08 NOTE — Telephone Encounter (Signed)
Scheduled

## 2016-04-08 NOTE — Telephone Encounter (Signed)
Can be scheduled for labs now, order in, thanks

## 2016-04-11 ENCOUNTER — Ambulatory Visit (INDEPENDENT_AMBULATORY_CARE_PROVIDER_SITE_OTHER): Payer: 59

## 2016-04-11 DIAGNOSIS — Z23 Encounter for immunization: Secondary | ICD-10-CM

## 2016-04-11 NOTE — Progress Notes (Signed)
Patient came in for flu shot, tolerated well

## 2016-04-18 ENCOUNTER — Other Ambulatory Visit (INDEPENDENT_AMBULATORY_CARE_PROVIDER_SITE_OTHER): Payer: 59

## 2016-04-18 DIAGNOSIS — R5383 Other fatigue: Secondary | ICD-10-CM

## 2016-04-18 DIAGNOSIS — E785 Hyperlipidemia, unspecified: Secondary | ICD-10-CM

## 2016-04-18 DIAGNOSIS — E559 Vitamin D deficiency, unspecified: Secondary | ICD-10-CM

## 2016-04-18 LAB — CBC WITH DIFFERENTIAL/PLATELET
BASOS ABS: 0 10*3/uL (ref 0.0–0.1)
BASOS PCT: 0.4 % (ref 0.0–3.0)
Eosinophils Absolute: 0.1 10*3/uL (ref 0.0–0.7)
Eosinophils Relative: 1.3 % (ref 0.0–5.0)
HEMATOCRIT: 38.9 % (ref 36.0–46.0)
Hemoglobin: 13.1 g/dL (ref 12.0–15.0)
LYMPHS PCT: 20.6 % (ref 12.0–46.0)
Lymphs Abs: 0.8 10*3/uL (ref 0.7–4.0)
MCHC: 33.7 g/dL (ref 30.0–36.0)
MCV: 89.6 fl (ref 78.0–100.0)
MONOS PCT: 8 % (ref 3.0–12.0)
Monocytes Absolute: 0.3 10*3/uL (ref 0.1–1.0)
NEUTROS ABS: 2.8 10*3/uL (ref 1.4–7.7)
Neutrophils Relative %: 69.7 % (ref 43.0–77.0)
PLATELETS: 214 10*3/uL (ref 150.0–400.0)
RBC: 4.34 Mil/uL (ref 3.87–5.11)
RDW: 12.8 % (ref 11.5–15.5)
WBC: 4 10*3/uL (ref 4.0–10.5)

## 2016-04-18 LAB — COMPREHENSIVE METABOLIC PANEL
ALK PHOS: 45 U/L (ref 39–117)
ALT: 18 U/L (ref 0–35)
AST: 22 U/L (ref 0–37)
Albumin: 4.5 g/dL (ref 3.5–5.2)
BUN: 24 mg/dL — ABNORMAL HIGH (ref 6–23)
CHLORIDE: 104 meq/L (ref 96–112)
CO2: 30 meq/L (ref 19–32)
Calcium: 9.2 mg/dL (ref 8.4–10.5)
Creatinine, Ser: 0.66 mg/dL (ref 0.40–1.20)
GFR: 98.01 mL/min (ref >60.00)
GLUCOSE: 92 mg/dL (ref 70–99)
POTASSIUM: 4.4 meq/L (ref 3.5–5.1)
Sodium: 141 mEq/L (ref 135–145)
Total Bilirubin: 0.6 mg/dL (ref 0.2–1.2)
Total Protein: 6.9 g/dL (ref 6.0–8.3)

## 2016-04-18 LAB — LIPID PANEL
CHOL/HDL RATIO: 2
Cholesterol: 166 mg/dL (ref 0–200)
HDL: 68.1 mg/dL (ref >39.00)
LDL CALC: 85 mg/dL (ref 0–99)
NonHDL: 98.27
TRIGLYCERIDES: 65 mg/dL (ref 0.0–149.0)
VLDL: 13 mg/dL (ref 0.0–40.0)

## 2016-04-18 LAB — VITAMIN D 25 HYDROXY (VIT D DEFICIENCY, FRACTURES): VITD: 39.15 ng/mL (ref 30.00–100.00)

## 2016-04-18 LAB — TSH: TSH: 1.6 u[IU]/mL (ref 0.35–4.50)

## 2016-04-20 ENCOUNTER — Encounter: Payer: Self-pay | Admitting: Internal Medicine

## 2016-04-25 ENCOUNTER — Encounter: Payer: Self-pay | Admitting: Internal Medicine

## 2016-04-25 ENCOUNTER — Ambulatory Visit (INDEPENDENT_AMBULATORY_CARE_PROVIDER_SITE_OTHER): Payer: 59 | Admitting: Internal Medicine

## 2016-04-25 VITALS — BP 124/78 | HR 72 | Temp 97.6°F | Resp 12 | Ht 62.0 in | Wt 111.2 lb

## 2016-04-25 DIAGNOSIS — Z0000 Encounter for general adult medical examination without abnormal findings: Secondary | ICD-10-CM | POA: Diagnosis not present

## 2016-04-25 DIAGNOSIS — Z780 Asymptomatic menopausal state: Secondary | ICD-10-CM

## 2016-04-25 DIAGNOSIS — M8580 Other specified disorders of bone density and structure, unspecified site: Secondary | ICD-10-CM | POA: Diagnosis not present

## 2016-04-25 NOTE — Progress Notes (Signed)
Patient ID: Cynthia Knight, female    DOB: May 11, 1961  Age: 55 y.o. MRN: [id number]  The patient is here for annual wellness examination and management of other chronic and acute problems.   PAP due Dec 2016 Charyl Dancer, [facility]  Colonoscopy due 2020. mammogram set up for mid November at [place]. DEXA 2013. T score was  -2.0  Not taking Vit D, .  No calcium supplements because she  has plenty in diet  Drinks. Milk at bedtime. Eats yogurt  And cheese daily. No history of fractures..  Exercises regularly . 5 days per week running.    The risk factors are reflected in the social history.  The roster of all physicians providing medical care to patient - is listed in the Snapshot section of the chart.   Home safety : The patient has smoke detectors in the home. They wear seatbelts.  There are no firearms at home. There is no violence in the home.   There is no risks for hepatitis, STDs or HIV. There is no   history of blood transfusion. They have no travel history to infectious disease endemic areas of the world.  The patient has seen their dentist in the last six month. They have seen their eye doctor in the last year.   They do  have excessive sun exposure and has dermatology follow up annually. . Discussed the need for sun protection: hats, long sleeves and use of sunscreen if there is significant sun exposure.   Diet: the importance of a healthy diet is discussed. They do have a healthy diet.  The benefits of regular aerobic exercise were discussed.   Depression screen: there are no signs or vegative symptoms of depression- irritability, change in appetite, anhedonia, sadness/tearfullness.  The following portions of the patient's history were reviewed and updated as appropriate: allergies, current medications, past family history, past medical history,  past surgical history, past social history  and problem list.  Visual acuity was not assessed per patient preference since she has regular  follow up with her ophthalmologist. Hearing and body mass index were assessed and reviewed.   During the course of the visit the patient was educated and counseled about appropriate screening and preventive services including : fall prevention , diabetes screening, nutrition counseling, colorectal cancer screening, and recommended immunizations.    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 Diagnoses of Encounter for preventive health examination and Osteopenia determined by x-ray were also pertinent to this visit.  History Temica has a past medical history of Migraine headache.   She has a past surgical history that includes Breast surgery (1980).   Her family history includes Cancer (age of onset: 81) in her father; Cancer (age of onset: 36) in her mother; Early death in her paternal grandfather and paternal grandmother; Heart disease in her paternal grandfather and paternal grandmother; Hyperlipidemia in her father; Hypertension in her brother; Osteoporosis in her mother.She reports that she has never smoked. She has never used smokeless tobacco. She reports that she drinks alcohol. She reports that she does not use drugs.  Outpatient Medications Prior to Visit  Medication Sig Dispense Refill  . carisoprodol (SOMA) 350 MG tablet Take 1 tablet (350 mg total) by mouth 3 (three) times daily as needed for muscle spasms. 60 tablet 0  . meloxicam (MOBIC) 15 MG tablet TAKE ONE TABLET EVERY DAY 30 tablet 5  . SUMAtriptan (IMITREX) 25 MG tablet 1 TABLET AS NEEDED FOR MIGRAINE MAY REPEAT DOSE IN  2 HOURS IF NEEDED MAX OF 2 TABLETS IN 24 HOURS 12 tablet 11  . aspirin 81 MG tablet Take 81 mg by mouth daily.    . cholecalciferol (VITAMIN D) 1000 UNITS tablet Take 1,000 Units by mouth daily.     No facility-administered medications prior to visit.     Review of Systems  Patient denies headache, fevers, malaise, unintentional weight loss, skin rash, eye pain, sinus  congestion and sinus pain, sore throat, dysphagia,  hemoptysis , cough, dyspnea, wheezing, chest pain, palpitations, orthopnea, edema, abdominal pain, nausea, melena, diarrhea, constipation, flank pain, dysuria, hematuria, urinary  Frequency, nocturia, numbness, tingling, seizures,  Focal weakness, Loss of consciousness,  Tremor, insomnia, depression, anxiety, and suicidal ideation.     Objective:  BP 124/78   Pulse 72   Temp 97.6 F (36.4 C) (Oral)   Resp 12   Ht 5\' 2"  (1.575 m)   Wt 111 lb 4 oz (50.5 kg)   SpO2 99%   BMI 20.35 kg/m   Physical Exam   General appearance: alert, cooperative and appears stated age Head: Normocephalic, without obvious abnormality, atraumatic Eyes: conjunctivae/corneas clear. PERRL, EOM's intact. Fundi benign. Ears: normal TM's and external ear canals both ears Nose: Nares normal. Septum midline. Mucosa normal. No drainage or sinus tenderness. Throat: lips, mucosa, and tongue normal; teeth and gums normal Neck: no adenopathy, no carotid bruit, no JVD, supple, symmetrical, trachea midline and thyroid not enlarged, symmetric, no tenderness/mass/nodules Lungs: clear to auscultation bilaterally Breasts: normal appearance, no masses or tenderness Heart: regular rate and rhythm, S1, S2 normal, no murmur, click, rub or gallop Abdomen: soft, non-tender; bowel sounds normal; no masses,  no organomegaly Extremities: extremities normal, atraumatic, no cyanosis or edema Pulses: 2+ and symmetric Skin: Skin color, texture, turgor normal. No rashes or lesions Neurologic: Alert and oriented X 3, normal strength and tone. Normal symmetric reflexes. Normal coordination and gait.     Assessment & Plan:   Problem List Items Addressed This Visit    Osteopenia determined by x-ray    Prior DEXA scan in 2013  did show some bone loss. Her lowest T score was -2.0 at the hip. We discussed the pros and cons of pharmacotherapy and decided to continue exercise, weightbearing,  calcium and vitamin D. Repeat DEXA scan is due in 2018       Encounter for preventive health examination    Annual comprehensive preventive exam was done as well as an evaluation and management of chronic conditions .  During the course of the visit the patient was educated and counseled about appropriate screening and preventive services including :  diabetes screening, lipid analysis with projected  10 year  risk for CAD , nutrition counseling, breast, cervical and colorectal cancer screening, and recommended immunizations.  Printed recommendations for health maintenance screenings was given  DEXA will be repeated either this year or next depending on insurance coverage.        Other Visit Diagnoses    Postmenopausal estrogen deficiency    -  Primary   Relevant Orders   DG Bone Density      I am having Ms. Yniguez maintain her cholecalciferol, aspirin, meloxicam, SUMAtriptan, and carisoprodol.  No orders of the defined types were placed in this encounter.   There are no discontinued medications.  Follow-up: No Follow-up on file.   Crecencio Mc, MD

## 2016-04-25 NOTE — Patient Instructions (Addendum)
I recommend taking 1000 to 2000 IU's of D3 daily through the ,winter months and getting   1200 to 1800 mg of calcium daily through diet (if possible )   We will repeat your DEXA either this year or in 2018 (pending insurance coverage)  Menopause is a normal process in which your reproductive ability comes to an end. This process happens gradually over a span of months to years, usually between the ages of 57 and 57. Menopause is complete when you have missed 12 consecutive menstrual periods. It is important to talk with your health care provider about some of the most common conditions that affect postmenopausal women, such as heart disease, cancer, and bone loss (osteoporosis). Adopting a healthy lifestyle and getting preventive care can help to promote your health and wellness. Those actions can also lower your chances of developing some of these common conditions. WHAT SHOULD I KNOW ABOUT MENOPAUSE? During menopause, you may experience a number of symptoms, such as:  Moderate-to-severe hot flashes.  Night sweats.  Decrease in sex drive.  Mood swings.  Headaches.  Tiredness.  Irritability.  Memory problems.  Insomnia. Choosing to treat or not to treat menopausal changes is an individual decision that you make with your health care provider. WHAT SHOULD I KNOW ABOUT HORMONE REPLACEMENT THERAPY AND SUPPLEMENTS? Hormone therapy products are effective for treating symptoms that are associated with menopause, such as hot flashes and night sweats. Hormone replacement carries certain risks, especially as you become older. If you are thinking about using estrogen or estrogen with progestin treatments, discuss the benefits and risks with your health care provider. WHAT SHOULD I KNOW ABOUT HEART DISEASE AND STROKE? Heart disease, heart attack, and stroke become more likely as you age. This may be due, in part, to the hormonal changes that your body experiences during menopause. These can  affect how your body processes dietary fats, triglycerides, and cholesterol. Heart attack and stroke are both medical emergencies. There are many things that you can do to help prevent heart disease and stroke:  Have your blood pressure checked at least every 1-2 years. High blood pressure causes heart disease and increases the risk of stroke.  If you are 57-57 years old, ask your health care provider if you should take aspirin to prevent a heart attack or a stroke.  Do not use any tobacco products, including cigarettes, chewing tobacco, or electronic cigarettes. If you need help quitting, ask your health care provider.  It is important to eat a healthy diet and maintain a healthy weight.  Be sure to include plenty of vegetables, fruits, low-fat dairy products, and lean protein.  Avoid eating foods that are high in solid fats, added sugars, or salt (sodium).  Get regular exercise. This is one of the most important things that you can do for your health.  Try to exercise for at least 150 minutes each week. The type of exercise that you do should increase your heart rate and make you sweat. This is known as moderate-intensity exercise.  Try to do strengthening exercises at least twice each week. Do these in addition to the moderate-intensity exercise.  Know your numbers.Ask your health care provider to check your cholesterol and your blood glucose. Continue to have your blood tested as directed by your health care provider. WHAT SHOULD I KNOW ABOUT CANCER SCREENING? There are several types of cancer. Take the following steps to reduce your risk and to catch any cancer development as early as possible. Breast Cancer  Practice breast self-awareness.  This means understanding how your breasts normally appear and feel.  It also means doing regular breast self-exams. Let your health care provider know about any changes, no matter how small.  If you are 57 or older, have a clinician do a  breast exam (clinical breast exam or CBE) every year. Depending on your age, family history, and medical history, it may be recommended that you also have a yearly breast X-ray (mammogram).  If you have a family history of breast cancer, talk with your health care provider about genetic screening.  If you are at high risk for breast cancer, talk with your health care provider about having an MRI and a mammogram every year.  Breast cancer (BRCA) gene test is recommended for women who have family members with BRCA-related cancers. Results of the assessment will determine the need for genetic counseling and BRCA1 and for BRCA2 testing. BRCA-related cancers include these types:  Breast. This occurs in males or females.  Ovarian.  Tubal. This may also be called fallopian tube cancer.  Cancer of the abdominal or pelvic lining (peritoneal cancer).  Prostate.  Pancreatic. Cervical, Uterine, and Ovarian Cancer Your health care provider may recommend that you be screened regularly for cancer of the pelvic organs. These include your ovaries, uterus, and vagina. This screening involves a pelvic exam, which includes checking for microscopic changes to the surface of your cervix (Pap test).  For women ages 57-57, health care providers may recommend a pelvic exam and a Pap test every three years. For women ages 57-57, they may recommend the Pap test and pelvic exam, combined with testing for human papilloma virus (HPV), every five years. Some types of HPV increase your risk of cervical cancer. Testing for HPV may also be done on women of any age who have unclear Pap test results.  Other health care providers may not recommend any screening for nonpregnant women who are considered low risk for pelvic cancer and have no symptoms. Ask your health care provider if a screening pelvic exam is right for you.  If you have had past treatment for cervical cancer or a condition that could lead to cancer, you need  Pap tests and screening for cancer for at least 20 years after your treatment. If Pap tests have been discontinued for you, your risk factors (such as having a new sexual partner) need to be reassessed to determine if you should start having screenings again. Some women have medical problems that increase the chance of getting cervical cancer. In these cases, your health care provider may recommend that you have screening and Pap tests more often.  If you have a family history of uterine cancer or ovarian cancer, talk with your health care provider about genetic screening.  If you have vaginal bleeding after reaching menopause, tell your health care provider.  There are currently no reliable tests available to screen for ovarian cancer. Lung Cancer Lung cancer screening is recommended for adults 3-61 years old who are at high risk for lung cancer because of a history of smoking. A yearly low-dose CT scan of the lungs is recommended if you:  Currently smoke.  Have a history of at least 30 pack-years of smoking and you currently smoke or have quit within the past 15 years. A pack-year is smoking an average of one pack of cigarettes per day for one year. Yearly screening should:  Continue until it has been 15 years since you quit.  Stop if you  develop a health problem that would prevent you from having lung cancer treatment. Colorectal Cancer  This type of cancer can be detected and can often be prevented.  Routine colorectal cancer screening usually begins at age 35 and continues through age 76.  If you have risk factors for colon cancer, your health care provider may recommend that you be screened at an earlier age.  If you have a family history of colorectal cancer, talk with your health care provider about genetic screening.  Your health care provider may also recommend using home test kits to check for hidden blood in your stool.  A small camera at the end of a tube can be used to  examine your colon directly (sigmoidoscopy or colonoscopy). This is done to check for the earliest forms of colorectal cancer.  Direct examination of the colon should be repeated every 5-10 years until age 18. However, if early forms of precancerous polyps or small growths are found or if you have a family history or genetic risk for colorectal cancer, you may need to be screened more often. Skin Cancer  Check your skin from head to toe regularly.  Monitor any moles. Be sure to tell your health care provider:  About any new moles or changes in moles, especially if there is a change in a mole's shape or color.  If you have a mole that is larger than the size of a pencil eraser.  If any of your family members has a history of skin cancer, especially at a young age, talk with your health care provider about genetic screening.  Always use sunscreen. Apply sunscreen liberally and repeatedly throughout the day.  Whenever you are outside, protect yourself by wearing long sleeves, pants, a wide-brimmed hat, and sunglasses. WHAT SHOULD I KNOW ABOUT OSTEOPOROSIS? Osteoporosis is a condition in which bone destruction happens more quickly than new bone creation. After menopause, you may be at an increased risk for osteoporosis. To help prevent osteoporosis or the bone fractures that can happen because of osteoporosis, the following is recommended:  If you are 47-44 years old, get at least 1,000 mg of calcium and at least 600 mg of vitamin D per day.  If you are older than age 35 but younger than age 25, get at least 1,200 mg of calcium and at least 600 mg of vitamin D per day.  If you are older than age 40, get at least 1,200 mg of calcium and at least 800 mg of vitamin D per day. Smoking and excessive alcohol intake increase the risk of osteoporosis. Eat foods that are rich in calcium and vitamin D, and do weight-bearing exercises several times each week as directed by your health care provider. WHAT  SHOULD I KNOW ABOUT HOW MENOPAUSE AFFECTS [place]? Depression may occur at any age, but it is more common as you become older. Common symptoms of depression include:  Low or sad mood.  Changes in sleep patterns.  Changes in appetite or eating patterns.  Feeling an overall lack of motivation or enjoyment of activities that you previously enjoyed.  Frequent crying spells. Talk with your health care provider if you think that you are experiencing depression. WHAT SHOULD I KNOW ABOUT IMMUNIZATIONS? It is important that you get and maintain your immunizations. These include:  Tetanus, diphtheria, and pertussis (Tdap) booster vaccine.  Influenza every year before the flu season begins.  Pneumonia vaccine.  Shingles vaccine. Your health care provider may also recommend other immunizations.  This information is not intended to replace advice given to you by your health care provider. Make sure you discuss any questions you have with your health care provider.   Document Released: 08/12/2005 Document Revised: 07/11/2014 Document Reviewed: 02/20/2014 Elsevier Interactive Patient Education [employer].

## 2016-04-25 NOTE — Progress Notes (Signed)
Pre-visit discussion using our clinic review tool. No additional management support is needed unless otherwise documented below in the visit note.  

## 2016-04-26 NOTE — Assessment & Plan Note (Signed)
Annual comprehensive preventive exam was done as well as an evaluation and management of chronic conditions .  During the course of the visit the patient was educated and counseled about appropriate screening and preventive services including :  diabetes screening, lipid analysis with projected  10 year  risk for CAD , nutrition counseling, breast, cervical and colorectal cancer screening, and recommended immunizations.  Printed recommendations for health maintenance screenings was given  DEXA will be repeated either this year or next depending on insurance coverage.

## 2016-04-26 NOTE — Assessment & Plan Note (Signed)
Prior DEXA scan in 2013  did show some bone loss. Her lowest T score was -2.0 at the hip. We discussed the pros and cons of pharmacotherapy and decided to continue exercise, weightbearing, calcium and vitamin D. Repeat DEXA scan is due in 2018

## 2016-04-28 ENCOUNTER — Encounter: Payer: Self-pay | Admitting: Internal Medicine

## 2016-06-08 ENCOUNTER — Ambulatory Visit
Admission: RE | Admit: 2016-06-08 | Discharge: 2016-06-08 | Disposition: A | Discharge status: Home / Self Care | Payer: 59 | Source: Ambulatory Visit | Attending: Internal Medicine | Admitting: Internal Medicine

## 2016-06-08 DIAGNOSIS — M810 Age-related osteoporosis without current pathological fracture: Secondary | ICD-10-CM | POA: Diagnosis not present

## 2016-06-08 DIAGNOSIS — E2839 Other primary ovarian failure: Secondary | ICD-10-CM | POA: Insufficient documentation

## 2016-06-08 DIAGNOSIS — Z780 Asymptomatic menopausal state: Secondary | ICD-10-CM | POA: Insufficient documentation

## 2016-06-11 ENCOUNTER — Encounter: Payer: Self-pay | Admitting: Internal Medicine

## 2016-07-20 ENCOUNTER — Ambulatory Visit: Payer: 59 | Admitting: Internal Medicine

## 2016-08-05 DIAGNOSIS — H02054 Trichiasis without entropian left upper eyelid: Secondary | ICD-10-CM | POA: Diagnosis not present

## 2016-08-08 DIAGNOSIS — H43812 Vitreous degeneration, left eye: Secondary | ICD-10-CM | POA: Diagnosis not present

## 2016-08-23 ENCOUNTER — Ambulatory Visit (INDEPENDENT_AMBULATORY_CARE_PROVIDER_SITE_OTHER): Payer: 59 | Admitting: Internal Medicine

## 2016-08-23 ENCOUNTER — Telehealth: Payer: Self-pay | Admitting: Internal Medicine

## 2016-08-23 ENCOUNTER — Encounter: Payer: Self-pay | Admitting: Internal Medicine

## 2016-08-23 DIAGNOSIS — M810 Age-related osteoporosis without current pathological fracture: Secondary | ICD-10-CM | POA: Diagnosis not present

## 2016-08-23 MED ORDER — RISEDRONATE SODIUM 35 MG PO TABS: 35.0000 mg | ORAL_TABLET | ORAL | 11 refills | Status: DC

## 2016-08-23 MED ORDER — ALENDRONATE SODIUM 70 MG PO TABS: 70.0000 mg | ORAL_TABLET | ORAL | 11 refills | Status: DC

## 2016-08-23 MED ORDER — MELOXICAM 15 MG PO TABS: 15.0000 mg | ORAL_TABLET | Freq: Every day | ORAL | 5 refills | Status: DC

## 2016-08-23 NOTE — Telephone Encounter (Signed)
Christy from [facility] called and stated that the generic risedronate (ACTONEL) 35 MG tablet is not a preferred copay. They wanted to know if they could switch it to fosamax for a $7.00 copay. Please advise, thank you!

## 2016-08-23 NOTE — Patient Instructions (Signed)
Risedronate delayed-release weekly tablets Florestine Avers) What is this medicine? RISEDRONATE (ris ED roe nate) slows calcium loss from the bone. It helps to make normal healthy bone and to slow bone loss in people with osteoporosis. COMMON BRAND NAME(S): Atelvia What should I tell my health care provider before I take this medicine? They need to know if you have any of these conditions: -esophageal, stomach, or intestine problems, like acid reflux or GERD -dental disease -kidney disease -low blood calcium -problems sitting or standing for 30 minutes -trouble swallowing -an unusual or allergic reaction to risedronate, other medicines, foods, dyes, or preservatives -pregnant or trying to get pregnant -breast-feeding How should I use this medicine? You must take this medicine exactly as directed or you will lower the amount of the medicine that you absorb into your body or you may cause yourself harm. Take this medicine by mouth after breakfast with a glass of water. Do not take this medicine before breakfast. Swallow the tablet with a full glass (6 to 8 ounces) of plain water. Do not take this medicine with any other drink. Do not chew, crush, or let the tablet dissolve in your mouth. Stand or sit up for at least 30 minutes after you take this medicine; do not lie down. Take this medicine on the same day every week. Do not take your medicine more often than directed. Talk to your pediatrician regarding the use of this medicine in children. Special care may be needed. What if I miss a dose? If you miss a dose, take the dose on the morning after you remember. Then take your next dose on your regular day of the week. Never take 2 tablets on the same day. Do not take double or extra doses. What may interact with this medicine? -aluminum hydroxide -antacids -aspirin -calcium supplements -NSAIDS, medicines for pain and inflammation, like ibuprofen or naproxen -iron supplements -magnesium  supplements -stomach acid blockers like cimetidine, famotidine, ranitidine, or omeprazole -vitamins with minerals What should I watch for while using this medicine? Visit your doctor or health care professional for regular check ups. It may be some time before you see the benefit from this medicine. Do not stop taking your medicine unless your doctor tells you to. Your doctor may order blood tests or other tests to see how you are doing. You should make sure that you get enough calcium and vitamin D while you are taking this medicine. Discuss the foods you eat and the vitamins you take with your health care professional. Some people who take this medicine have severe bone, joint, and/or muscle pain. This medicine may also increase your risk for a broken thigh bone. Tell your doctor right away if you have pain in your upper leg or groin. Tell your doctor if you have any pain that does not go away or that gets worse. What side effects may I notice from receiving this medicine? Side effects that you should report to your doctor or health care professional as soon as possible: -allergic reactions such as skin rash or itching, hives, swelling of the face, lips, throat, or tongue -black or tarry stools -changes in vision -chest pain -heartburn or stomach pain -jaw pain, especially after dental work -pain or difficulty when swallowing -redness, blistering, peeling, or loosening of the skin, including inside the mouth Side effects that usually do not require medical attention (report to your doctor or health care professional if they continue or are bothersome): -bone, muscle, or joint pain -changes in taste -constipation -  diarrhea -eye pain or itching -headache -nausea, vomiting -stomach gas Where should I keep my medicine? Keep out of the reach of children. Store at room temperature between 20 and 25 degrees C (68 and 77 degrees F). Throw away any unused medication after the expiration date.   2017 Elsevier/Gold Standard (2015-07-23 10:16:06)

## 2016-08-23 NOTE — Progress Notes (Signed)
Subjective:  Patient ID: Cynthia Knight, female    DOB: January 18, 1961  Age: 56 y.o. MRN: [id number]  CC: The encounter diagnosis was Age-related osteoporosis without current pathological fracture.  HPI Cynthia Knight presents for DISCUSSION of treatment options for osteoporosis.  She has osteoporosis diagnosed by recent DEXA with T scores in the spine.  She has no history of fractures,  Her n\mother had osteoporosis .  She had early menopause.  exercises daily.  No dietary restrictions    Outpatient Medications Prior to Visit  Medication Sig Dispense Refill  . aspirin 81 MG tablet Take 81 mg by mouth daily.    . carisoprodol (SOMA) 350 MG tablet Take 1 tablet (350 mg total) by mouth 3 (three) times daily as needed for muscle spasms. 60 tablet 0  . cholecalciferol (VITAMIN D) 1000 UNITS tablet Take 1,000 Units by mouth daily.    . SUMAtriptan (IMITREX) 25 MG tablet 1 TABLET AS NEEDED FOR MIGRAINE MAY REPEAT DOSE IN 2 HOURS IF NEEDED MAX OF 2 TABLETS IN 24 HOURS 12 tablet 11  . meloxicam (MOBIC) 15 MG tablet TAKE ONE TABLET EVERY DAY 30 tablet 5   No facility-administered medications prior to visit.     Review of Systems;  Patient denies headache, fevers, malaise, unintentional weight loss, skin rash, eye pain, sinus congestion and sinus pain, sore throat, dysphagia,  hemoptysis , cough, dyspnea, wheezing, chest pain, palpitations, orthopnea, edema, abdominal pain, nausea, melena, diarrhea, constipation, flank pain, dysuria, hematuria, urinary  Frequency, nocturia, numbness, tingling, seizures,  Focal weakness, Loss of consciousness,  Tremor, insomnia, depression, anxiety, and suicidal ideation.      Objective:  BP 108/76   Pulse 92   Resp 16   Wt 115 lb (52.2 kg)   SpO2 95%   BMI 21.03 kg/m   BP Readings from Last 3 Encounters:  08/23/16 108/76  04/25/16 124/78  03/25/15 108/70    Wt Readings from Last 3 Encounters:  08/23/16 115 lb (52.2 kg)  04/25/16 111 lb 4 oz (50.5  kg)  03/25/15 109 lb (49.4 kg)    General appearance: alert, cooperative and appears stated age Back: symmetric, no curvature. ROM normal. No CVA tenderness. Lungs: clear to auscultation bilaterally Heart: regular rate and rhythm, S1, S2 normal, no murmur, click, rub or gallop Abdomen: soft, non-tender; bowel sounds normal; no masses,  no organomegaly Pulses: 2+ and symmetric No results found for: HGBA1C  Lab Results  Component Value Date   CREATININE 0.66 04/18/2016   CREATININE 0.63 03/23/2015   CREATININE 0.7 03/18/2014    Lab Results  Component Value Date   WBC 4.0 04/18/2016   HGB 13.1 04/18/2016   HCT 38.9 04/18/2016   PLT 214.0 04/18/2016   GLUCOSE 92 04/18/2016   CHOL 166 04/18/2016   TRIG 65.0 04/18/2016   HDL 68.10 04/18/2016   LDLCALC 85 04/18/2016   ALT 18 04/18/2016   AST 22 04/18/2016   NA 141 04/18/2016   K 4.4 04/18/2016   CL 104 04/18/2016   CREATININE 0.66 04/18/2016   BUN 24 (H) 04/18/2016   CO2 30 04/18/2016   TSH 1.60 04/18/2016    Dg Bone Density  Result Date: 06/08/2016 EXAM: DUAL X-RAY ABSORPTIOMETRY (DXA) FOR BONE MINERAL DENSITY IMPRESSION: Dear Dr. Derrel Knight, Your patient Cynthia Knight completed a BMD test on 06/08/2016 using the Cynthia Knight (analysis version: 14.10) manufactured by Cynthia Knight. The following summarizes the results of our evaluation. PATIENT BIOGRAPHICAL: Name: Cynthia Knight, Cynthia Knight Patient  ID: [id number] Birth Date: 02-12-1961 Height: 62.0 in. Gender: Female Exam Date: 06/08/2016 Weight: 111.3 lbs. Indications: Caucasian, Osteopenia, Postmenopausal Fractures: Treatments: ASSESSMENT: The BMD measured at AP Spine L1-L4 is 0.846 g/cm2 with a T-score of -2.8. This patient is considered osteoporotic according to [facility] criteria. Site Region Measured Measured WHO Young Adult BMD Date       Age      Classification T-score AP Spine L1-L4 06/08/2016 [age] Osteoporosis -2.8 0.846 g/cm2 DualFemur Neck Right  06/08/2016 57.4 Osteopenia -1.5 0.832 g/cm2 [facility]) criteria for post-menopausal, Caucasian Women: Normal:       T-score at or above -1 SD Osteopenia:   T-score between -1 and -2.5 SD Osteoporosis: T-score at or below -2.5 SD RECOMMENDATIONS: [place] recommends that FDA-approved medical therapies be considered in postmenopausal women and men age 46 or older with a: 1. Hip or vertebral (clinical or morphometric) fracture. 2. T-score of < -2.5 at the spine or hip. 3. Ten-year fracture probability by FRAX of 3% or greater for hip fracture or 20% or greater for major osteoporotic fracture. All treatment decisions require clinical judgment and consideration of individual patient factors, including patient preferences, co-morbidities, previous drug use, risk factors not captured in the FRAX model (e.g. falls, vitamin D deficiency, increased bone turnover, interval significant decline in bone density) and possible under - or over-estimation of fracture risk by FRAX. All patients should ensure an adequate intake of dietary calcium (1200 mg/d) and vitamin D (800 IU daily) unless contraindicated. FOLLOW-UP: People with diagnosed cases of osteoporosis or at high risk for fracture should have regular bone mineral density tests. For patients eligible for Medicare, routine testing is allowed once every 2 years. The testing frequency can be increased to one year for patients who have rapidly progressing disease, those who are receiving or discontinuing medical therapy to restore bone mass, or have additional risk factors. I have reviewed this report, and agree with the above findings. [facility] Radiology Electronically Signed   By: Cynthia Manes M.D.   On: 06/08/2016 09:45    Assessment & Plan:   Problem List Items Addressed This Visit    Osteoporosis    by recent dexa, involving the spine.  Discussed the risks and benefits of treatment, various modalities.  Alendronate  weekly .  Repeat dexa 2 years          I have changed [name]'s meloxicam. I am also having her maintain her cholecalciferol, aspirin, SUMAtriptan, and carisoprodol.  Meds ordered this encounter  Medications  . meloxicam (MOBIC) 15 MG tablet    Sig: Take 1 tablet (15 mg total) by mouth daily.    Dispense:  30 tablet    Refill:  5  . DISCONTD: risedronate (ACTONEL) 35 MG tablet    Sig: Take 1 tablet (35 mg total) by mouth every 7 (seven) days. with water on empty stomach, nothing by mouth or lie down for next 30 minutes.    Dispense:  4 tablet    Refill:  11    Medications Discontinued During This Encounter  Medication Reason  . meloxicam (MOBIC) 15 MG tablet Reorder    Follow-up: No Follow-up on file.   Crecencio Mc, MD

## 2016-08-23 NOTE — Telephone Encounter (Signed)
Yes,  New rx sent

## 2016-08-23 NOTE — Progress Notes (Signed)
Pre visit review using our clinic review tool, if applicable. No additional management support is needed unless otherwise documented below in the visit note. 

## 2016-08-24 NOTE — Assessment & Plan Note (Signed)
by recent dexa, involving the spine.  Discussed the risks and benefits of treatment, various modalities.  Alendronate weekly .  Repeat dexa 2 years

## 2016-08-24 NOTE — Telephone Encounter (Signed)
Pt informed

## 2016-08-27 ENCOUNTER — Encounter: Payer: Self-pay | Admitting: Internal Medicine

## 2016-08-30 ENCOUNTER — Other Ambulatory Visit: Payer: Self-pay | Admitting: Internal Medicine

## 2016-08-30 MED ORDER — FLUTICASONE PROPIONATE 50 MCG/ACT NA SUSP: 2.0000 | Freq: Every day | NASAL | 6 refills | Status: DC

## 2016-08-31 ENCOUNTER — Other Ambulatory Visit: Payer: Self-pay | Admitting: Internal Medicine

## 2016-08-31 NOTE — Telephone Encounter (Signed)
MED REFILLED  

## 2016-08-31 NOTE — Telephone Encounter (Signed)
Last OV 08/23/16 ok to fill Imitrex?

## 2016-10-18 DIAGNOSIS — M79651 Pain in right thigh: Secondary | ICD-10-CM | POA: Diagnosis not present

## 2016-10-18 DIAGNOSIS — M4317 Spondylolisthesis, lumbosacral region: Secondary | ICD-10-CM | POA: Diagnosis not present

## 2016-12-15 DIAGNOSIS — H0015 Chalazion left lower eyelid: Secondary | ICD-10-CM | POA: Diagnosis not present

## 2017-01-17 DIAGNOSIS — D0472 Carcinoma in situ of skin of left lower limb, including hip: Secondary | ICD-10-CM | POA: Diagnosis not present

## 2017-01-17 DIAGNOSIS — D485 Neoplasm of uncertain behavior of skin: Secondary | ICD-10-CM | POA: Diagnosis not present

## 2017-01-17 DIAGNOSIS — S40011A Contusion of right shoulder, initial encounter: Secondary | ICD-10-CM | POA: Diagnosis not present

## 2017-01-17 DIAGNOSIS — Z85828 Personal history of other malignant neoplasm of skin: Secondary | ICD-10-CM | POA: Diagnosis not present

## 2017-01-31 DIAGNOSIS — D0472 Carcinoma in situ of skin of left lower limb, including hip: Secondary | ICD-10-CM | POA: Diagnosis not present

## 2017-01-31 DIAGNOSIS — Z79899 Other long term (current) drug therapy: Secondary | ICD-10-CM | POA: Diagnosis not present

## 2017-01-31 DIAGNOSIS — Z7689 Persons encountering health services in other specified circumstances: Secondary | ICD-10-CM | POA: Diagnosis not present

## 2017-01-31 DIAGNOSIS — Z85828 Personal history of other malignant neoplasm of skin: Secondary | ICD-10-CM | POA: Diagnosis not present

## 2017-01-31 DIAGNOSIS — I471 Supraventricular tachycardia: Secondary | ICD-10-CM | POA: Diagnosis not present

## 2017-01-31 DIAGNOSIS — Z7983 Long term (current) use of bisphosphonates: Secondary | ICD-10-CM | POA: Diagnosis not present

## 2017-01-31 DIAGNOSIS — L578 Other skin changes due to chronic exposure to nonionizing radiation: Secondary | ICD-10-CM | POA: Diagnosis not present

## 2017-02-01 DIAGNOSIS — I471 Supraventricular tachycardia: Secondary | ICD-10-CM | POA: Diagnosis not present

## 2017-02-27 ENCOUNTER — Other Ambulatory Visit: Payer: Self-pay | Admitting: Internal Medicine

## 2017-02-27 MED ORDER — ESZOPICLONE 1 MG PO TABS: 1.0000 mg | ORAL_TABLET | Freq: Every evening | ORAL | 0 refills | Status: AC | PRN

## 2017-05-10 DIAGNOSIS — Z85828 Personal history of other malignant neoplasm of skin: Secondary | ICD-10-CM | POA: Diagnosis not present

## 2017-05-10 DIAGNOSIS — L570 Actinic keratosis: Secondary | ICD-10-CM | POA: Diagnosis not present

## 2017-05-11 DIAGNOSIS — Z1231 Encounter for screening mammogram for malignant neoplasm of breast: Secondary | ICD-10-CM | POA: Diagnosis not present

## 2017-05-11 DIAGNOSIS — Z803 Family history of malignant neoplasm of breast: Secondary | ICD-10-CM | POA: Diagnosis not present

## 2017-05-11 DIAGNOSIS — M8589 Other specified disorders of bone density and structure, multiple sites: Secondary | ICD-10-CM | POA: Diagnosis not present

## 2017-05-11 LAB — HM DEXA SCAN

## 2017-05-11 LAB — HM MAMMOGRAPHY

## 2017-05-28 ENCOUNTER — Telehealth: Payer: Self-pay | Admitting: Internal Medicine

## 2017-05-28 NOTE — Telephone Encounter (Signed)
Please document that a DEXA was done : osteopenia,  (red folder)  My Chart message sent

## 2017-07-06 DIAGNOSIS — N952 Postmenopausal atrophic vaginitis: Secondary | ICD-10-CM | POA: Diagnosis not present

## 2017-07-06 DIAGNOSIS — Z01419 Encounter for gynecological examination (general) (routine) without abnormal findings: Secondary | ICD-10-CM | POA: Diagnosis not present

## 2017-07-20 DIAGNOSIS — L821 Other seborrheic keratosis: Secondary | ICD-10-CM | POA: Diagnosis not present

## 2017-08-28 ENCOUNTER — Other Ambulatory Visit: Payer: Self-pay | Admitting: Internal Medicine

## 2017-08-29 NOTE — Telephone Encounter (Signed)
Refilled: 08/23/2016 Last OV: 08/23/2016 Next OV: not scheduled

## 2017-09-15 DIAGNOSIS — H02054 Trichiasis without entropian left upper eyelid: Secondary | ICD-10-CM | POA: Diagnosis not present

## 2017-10-12 ENCOUNTER — Other Ambulatory Visit: Payer: Self-pay | Admitting: Internal Medicine

## 2017-10-18 ENCOUNTER — Other Ambulatory Visit: Payer: Self-pay | Admitting: Internal Medicine

## 2017-10-18 NOTE — Telephone Encounter (Signed)
Refilled: 08/31/2016 Last OV: 08/23/2016 Next OV: not scheduled

## 2017-11-23 ENCOUNTER — Other Ambulatory Visit: Payer: Self-pay | Admitting: Internal Medicine

## 2017-11-23 NOTE — Telephone Encounter (Signed)
Refilled: 08/23/2016 Last OV: 08/23/2016 Next OV: not scheduled

## 2017-12-29 ENCOUNTER — Other Ambulatory Visit: Payer: Self-pay | Admitting: Internal Medicine

## 2018-01-01 ENCOUNTER — Encounter: Payer: Self-pay | Admitting: Internal Medicine

## 2018-01-01 NOTE — Telephone Encounter (Signed)
Please notify patient that the prescription  was Refilled for 30 days only because it has been 16 months since last visit. Marland Kitchen  OFFICE VISIT NEEDED prior to any more refills

## 2018-01-01 NOTE — Telephone Encounter (Signed)
Refilled: 10/19/2015 Last OV: 08/23/2016 Next OV: not scheduled

## 2018-01-02 NOTE — Telephone Encounter (Signed)
Spoke with pt and she stated that [place] is at the beach right now and when she gets back she will call to schedule her CPE.

## 2018-01-08 DIAGNOSIS — G43709 Chronic migraine without aura, not intractable, without status migrainosus: Secondary | ICD-10-CM | POA: Diagnosis not present

## 2018-01-08 DIAGNOSIS — F5101 Primary insomnia: Secondary | ICD-10-CM | POA: Diagnosis not present

## 2018-01-08 DIAGNOSIS — M8589 Other specified disorders of bone density and structure, multiple sites: Secondary | ICD-10-CM | POA: Diagnosis not present

## 2018-01-26 DIAGNOSIS — L570 Actinic keratosis: Secondary | ICD-10-CM | POA: Diagnosis not present

## 2018-01-26 DIAGNOSIS — L448 Other specified papulosquamous disorders: Secondary | ICD-10-CM | POA: Diagnosis not present

## 2018-02-14 DIAGNOSIS — L570 Actinic keratosis: Secondary | ICD-10-CM | POA: Diagnosis not present

## 2018-02-14 DIAGNOSIS — D235 Other benign neoplasm of skin of trunk: Secondary | ICD-10-CM | POA: Diagnosis not present

## 2018-02-14 DIAGNOSIS — X32XXXA Exposure to sunlight, initial encounter: Secondary | ICD-10-CM | POA: Diagnosis not present

## 2018-02-14 DIAGNOSIS — D2372 Other benign neoplasm of skin of left lower limb, including hip: Secondary | ICD-10-CM | POA: Diagnosis not present

## 2018-02-14 DIAGNOSIS — Z85828 Personal history of other malignant neoplasm of skin: Secondary | ICD-10-CM | POA: Diagnosis not present

## 2018-03-14 DIAGNOSIS — R05 Cough: Secondary | ICD-10-CM | POA: Diagnosis not present

## 2018-04-26 ENCOUNTER — Other Ambulatory Visit: Payer: Self-pay | Admitting: Internal Medicine

## 2018-04-30 NOTE — Telephone Encounter (Signed)
Refilled: 01/01/2018 Last OV: 08/05/2017 Next OV: not scheduled

## 2018-05-05 DIAGNOSIS — H43811 Vitreous degeneration, right eye: Secondary | ICD-10-CM | POA: Diagnosis not present

## 2018-05-09 DIAGNOSIS — Z0000 Encounter for general adult medical examination without abnormal findings: Secondary | ICD-10-CM | POA: Diagnosis not present

## 2018-05-14 DIAGNOSIS — Z1231 Encounter for screening mammogram for malignant neoplasm of breast: Secondary | ICD-10-CM | POA: Diagnosis not present

## 2018-05-14 DIAGNOSIS — Z803 Family history of malignant neoplasm of breast: Secondary | ICD-10-CM | POA: Diagnosis not present

## 2018-05-16 DIAGNOSIS — Z0000 Encounter for general adult medical examination without abnormal findings: Secondary | ICD-10-CM | POA: Diagnosis not present

## 2018-05-16 DIAGNOSIS — D72819 Decreased white blood cell count, unspecified: Secondary | ICD-10-CM | POA: Diagnosis not present

## 2018-06-06 DIAGNOSIS — H43811 Vitreous degeneration, right eye: Secondary | ICD-10-CM | POA: Diagnosis not present

## 2018-07-11 DIAGNOSIS — N952 Postmenopausal atrophic vaginitis: Secondary | ICD-10-CM | POA: Diagnosis not present

## 2018-07-11 DIAGNOSIS — Z1211 Encounter for screening for malignant neoplasm of colon: Secondary | ICD-10-CM | POA: Diagnosis not present

## 2018-07-11 DIAGNOSIS — Z01419 Encounter for gynecological examination (general) (routine) without abnormal findings: Secondary | ICD-10-CM | POA: Diagnosis not present

## 2018-07-17 ENCOUNTER — Other Ambulatory Visit: Payer: Self-pay | Admitting: Internal Medicine

## 2018-08-22 DIAGNOSIS — D225 Melanocytic nevi of trunk: Secondary | ICD-10-CM | POA: Diagnosis not present

## 2018-08-22 DIAGNOSIS — Z85828 Personal history of other malignant neoplasm of skin: Secondary | ICD-10-CM | POA: Diagnosis not present

## 2018-08-22 DIAGNOSIS — D2262 Melanocytic nevi of left upper limb, including shoulder: Secondary | ICD-10-CM | POA: Diagnosis not present

## 2018-08-30 DIAGNOSIS — R42 Dizziness and giddiness: Secondary | ICD-10-CM | POA: Diagnosis not present

## 2018-09-12 ENCOUNTER — Other Ambulatory Visit: Payer: Self-pay | Admitting: Internal Medicine

## 2018-09-12 MED ORDER — SCOPOLAMINE 1 MG/3DAYS TD PT72: 1.0000 | MEDICATED_PATCH | TRANSDERMAL | 0 refills | Status: AC

## 2018-09-12 NOTE — Progress Notes (Signed)
[facility]

## 2018-11-09 DIAGNOSIS — Z0000 Encounter for general adult medical examination without abnormal findings: Secondary | ICD-10-CM | POA: Diagnosis not present

## 2018-11-09 DIAGNOSIS — G43709 Chronic migraine without aura, not intractable, without status migrainosus: Secondary | ICD-10-CM | POA: Diagnosis not present

## 2018-11-09 DIAGNOSIS — D72819 Decreased white blood cell count, unspecified: Secondary | ICD-10-CM | POA: Diagnosis not present

## 2018-11-16 DIAGNOSIS — D72819 Decreased white blood cell count, unspecified: Secondary | ICD-10-CM | POA: Diagnosis not present

## 2018-11-16 DIAGNOSIS — G43709 Chronic migraine without aura, not intractable, without status migrainosus: Secondary | ICD-10-CM | POA: Diagnosis not present

## 2018-11-16 DIAGNOSIS — F5101 Primary insomnia: Secondary | ICD-10-CM | POA: Diagnosis not present

## 2019-01-11 ENCOUNTER — Other Ambulatory Visit: Payer: Self-pay | Admitting: Internal Medicine

## 2019-01-11 NOTE — Telephone Encounter (Signed)
Pt has not been seen since 2018. Do you want pt to re-establish with you or does she need to establish with someone else?

## 2019-01-13 NOTE — Telephone Encounter (Signed)
She can reestablish with me.  I'll send her a [employer].

## 2019-03-15 ENCOUNTER — Other Ambulatory Visit: Payer: Self-pay | Admitting: Internal Medicine

## 2019-04-05 ENCOUNTER — Other Ambulatory Visit: Payer: Self-pay | Admitting: Internal Medicine

## 2019-05-20 ENCOUNTER — Other Ambulatory Visit: Payer: Self-pay | Admitting: Internal Medicine

## 2019-05-20 NOTE — Telephone Encounter (Signed)
Refilled: 04/08/2019 Last OV: 08/23/2016 Next OV: not scheduled

## 2019-08-02 ENCOUNTER — Other Ambulatory Visit: Payer: Self-pay | Admitting: Internal Medicine

## 2019-10-25 ENCOUNTER — Ambulatory Visit: Payer: Self-pay | Attending: Internal Medicine

## 2019-10-25 DIAGNOSIS — Z23 Encounter for immunization: Secondary | ICD-10-CM

## 2019-10-25 NOTE — Progress Notes (Signed)
   Covid-19 Vaccination Clinic  Name:  Cynthia Knight    MRN: [id number] DOB: [birth year]  10/25/2019  [name] was observed post Covid-19 immunization for 15 minutes without incident. She was provided with Vaccine Information Sheet and instruction to access the V-Safe system.   [name] was instructed to call 911 with any severe reactions post vaccine: Marland Kitchen Difficulty breathing  . Swelling of face and throat  . A fast heartbeat  . A bad rash all over body  . Dizziness and weakness   Immunizations Administered    Name Date Dose VIS Date Route   Pfizer COVID-19 Vaccine 10/25/2019 10:22 AM 0.3 mL 08/28/2018 Intramuscular   Manufacturer: [place]   Lot: [id number]   [place]: [id number]

## 2019-11-19 ENCOUNTER — Ambulatory Visit: Payer: Self-pay | Attending: Internal Medicine

## 2019-11-19 DIAGNOSIS — Z23 Encounter for immunization: Secondary | ICD-10-CM

## 2019-11-19 NOTE — Progress Notes (Signed)
   Covid-19 Vaccination Clinic  Name:  Cynthia Knight    MRN: [id number] DOB: [birth year]  11/19/2019  [name] was observed post Covid-19 immunization for 15 minutes without incident. She was provided with Vaccine Information Sheet and instruction to access the V-Safe system.   [name] was instructed to call 911 with any severe reactions post vaccine: Marland Kitchen Difficulty breathing  . Swelling of face and throat  . A fast heartbeat  . A bad rash all over body  . Dizziness and weakness   Immunizations Administered    Name Date Dose VIS Date Route   Pfizer COVID-19 Vaccine 11/19/2019 10:32 AM 0.3 mL 08/28/2018 Intramuscular   Manufacturer: [place]   Lot: [id number]   [place]: [id number]

## 2020-09-23 ENCOUNTER — Emergency Department
Admission: EM | Admit: 2020-09-23 | Discharge: 2020-09-23 | Disposition: A | Discharge status: Home / Self Care | Payer: 59 | Attending: Emergency Medicine | Admitting: Emergency Medicine

## 2020-09-23 ENCOUNTER — Other Ambulatory Visit: Payer: Self-pay

## 2020-09-23 DIAGNOSIS — R55 Syncope and collapse: Secondary | ICD-10-CM | POA: Insufficient documentation

## 2020-09-23 DIAGNOSIS — Z7982 Long term (current) use of aspirin: Secondary | ICD-10-CM | POA: Insufficient documentation

## 2020-09-23 LAB — BASIC METABOLIC PANEL
Anion gap: 7 (ref 5–15)
BUN: 24 mg/dL — ABNORMAL HIGH (ref 8–23)
CO2: 26 mmol/L (ref 22–32)
Calcium: 9.2 mg/dL (ref 8.9–10.3)
Chloride: 108 mmol/L (ref 98–111)
Creatinine, Ser: 0.73 mg/dL (ref 0.44–1.00)
GFR, Estimated: >60 mL/min (ref >60)
Glucose, Bld: 164 mg/dL — ABNORMAL HIGH (ref 70–99)
Potassium: 4.1 mmol/L (ref 3.5–5.1)
Sodium: 141 mmol/L (ref 135–145)

## 2020-09-23 LAB — CBC
HCT: 42.7 % (ref 36.0–46.0)
Hemoglobin: 13.8 g/dL (ref 12.0–15.0)
MCH: 30.1 pg (ref 26.0–34.0)
MCHC: 32.3 g/dL (ref 30.0–36.0)
MCV: 93.2 fL (ref 80.0–100.0)
Platelets: 237 10*3/uL (ref 150–400)
RBC: 4.58 MIL/uL (ref 3.87–5.11)
RDW: 12.9 % (ref 11.5–15.5)
WBC: 7.3 10*3/uL (ref 4.0–10.5)
nRBC: 0 % (ref 0.0–0.2)

## 2020-09-23 NOTE — ED Triage Notes (Signed)
Pt to ER via POV. Pt reports syncopal episode after sitting down on the toilet this morning. Pt reports her spouse witnessed the event, pt did not lose color but did lose consciousness for several minutes. Pt reports feeling fatigued. Denies SHOB or chest pain. States she has had some L sided ear congestion but no other symptoms.

## 2020-09-23 NOTE — ED Provider Notes (Signed)
[facility] Emergency Department Provider Note   ____________________________________________   Event Date/Time   First MD Initiated Contact with Patient 09/23/20 1406     (approximate)  I have reviewed the triage vital signs and the nursing notes.   HISTORY  Chief Complaint Near Syncope    HPI Cynthia Knight is a 60 y.o. female with a stated past medical history of migraines who presents after a syncopal event that occurred while she was sitting on the toilet after just waking up this morning.  Patient's husband is at bedside and states that she had episode of leaning her head back onto the wall in the bathroom and losing consciousness for approximately 2 minutes.  Husband states that patient had unknown pulselessness as he did not check until he moved patient over to the bed and found that she did have a good pulse.  Patient came back to baseline within 5 minutes and has not had any subsequent episodes of syncope.  Patient denies any similar symptoms in the past.  Patient currently denies any vision changes, tinnitus, difficulty speaking, facial droop, sore throat, chest pain, shortness of breath, abdominal pain, nausea/vomiting/diarrhea, dysuria, or weakness/numbness/paresthesias in any extremity         Past Medical History:  Diagnosis Date  . Migraine headache    previously hormonal,   twice month currently    Patient Active Problem List   Diagnosis Date Noted  . Encounter for preventive health examination 03/14/2012  . Enthesopathy of ankle and tarsus 08/10/2011  . Menopause 08/09/2011  . Osteoporosis 08/09/2011  . Migraine headache without aura   . Leukocytopenia 05/19/2010  . MIGRAINE W/AURA W/O INTRACT W/O STATUS MIGRNOSUS 08/28/2007    Past Surgical History:  Procedure Laterality Date  . BREAST SURGERY  1980   fibrocystic ,  right breast     Prior to Admission medications   Medication Sig Start Date End Date Taking? Authorizing  Provider  alendronate (FOSAMAX) 70 MG tablet TAKE 1 TABLET EVERY 7 DAYS WITH A FULL GLASS OF WATER ON AN EMPTY STOMACH DO NOT LIE DOWN FOR AT LEAST 30 MIN 08/02/19   Crecencio Mc, MD  aspirin 81 MG tablet Take 81 mg by mouth daily.    [provider]  carisoprodol (SOMA) 350 MG tablet TAKE ONE TABLET BY MOUTH 3 TIMES DAILY AS NEEDED FOR MUSCLE SPASM 04/30/18   Crecencio Mc, MD  cholecalciferol (VITAMIN D) 1000 UNITS tablet Take 1,000 Units by mouth daily.    [provider]  eszopiclone (LUNESTA) 1 MG TABS tablet Take 1 tablet (1 mg total) by mouth at bedtime as needed for sleep. Take immediately before bedtime 02/27/17   Crecencio Mc, MD  fluticasone [facility]) 50 MCG/ACT nasal spray Place 2 sprays into both nostrils daily. OVERDUE FOR AN OFFICE VISIT. PLEASE CALL OFFICE SOON TO PREVENT REFILL DELAYS 10/12/17   Crecencio Mc, MD  meloxicam (MOBIC) 15 MG tablet TAKE ONE TABLET EVERY DAY 11/23/17   Crecencio Mc, MD  scopolamine (TRANSDERM-SCOP, 1.5 MG,) 1 MG/3DAYS Place 1 patch (1.5 mg total) onto the skin every 3 (three) days. 09/12/18   Crecencio Mc, MD  SUMAtriptan (IMITREX) 25 MG tablet TAKE 1 TABLET BY MOUTH AS NEEDED FOR MIGRAINE. MAY REPEAT IN 2 HOURS.MAX OF 2 TABLETS PER DAY. 04/08/19   Crecencio Mc, MD    Allergies Patient has no known allergies.  Family History  Problem Relation Age of Onset  . Cancer Mother  56       breast  . Osteoporosis Mother   . Hyperlipidemia Father   . Cancer Father 62       brain tumor  . Early death Paternal Grandmother   . Heart disease Paternal Grandmother   . Early death Paternal Grandfather   . Heart disease Paternal Grandfather   . Hypertension Brother     Social History Social History   Tobacco Use  . Smoking status: Never Smoker  . Smokeless tobacco: Never Used  Substance Use Topics  . Alcohol use: Yes    Comment: socially  . Drug use: No    Review of Systems Constitutional: No fever/chills Eyes: No  visual changes. ENT: No sore throat. Cardiovascular: Denies chest pain. Respiratory: Denies shortness of breath. Gastrointestinal: No abdominal pain.  No nausea, no vomiting.  No diarrhea. Genitourinary: Negative for dysuria. Musculoskeletal: Negative for acute arthralgias Skin: Negative for rash. Neurological: Negative for headaches, weakness/numbness/paresthesias in any extremity Psychiatric: Negative for suicidal ideation/homicidal ideation   ____________________________________________   PHYSICAL EXAM:  VITAL SIGNS: ED Triage Vitals  Enc Vitals Group     BP 09/23/20 1324 120/76     Pulse Rate 09/23/20 1324 66     Resp 09/23/20 1324 18     Temp 09/23/20 1324 97.9 F (36.6 C)     Temp Source 09/23/20 1324 Oral     SpO2 09/23/20 1324 100 %     Weight 09/23/20 1325 105 lb (47.6 kg)     Height 09/23/20 1325 5\' 2"  (1.575 m)     Head Circumference --      Peak Flow --      Pain Score 09/23/20 1325 0     Pain Loc --      Pain Edu? --      Excl. in [place]? --    Constitutional: Alert and oriented. Well appearing and in no acute distress. Eyes: Conjunctivae are normal. PERRL. Head: Atraumatic. Nose: No congestion/rhinnorhea. Mouth/Throat: Mucous membranes are moist. Neck: No stridor Cardiovascular: Grossly normal heart sounds.  Good peripheral circulation. Respiratory: Normal respiratory effort.  No retractions. Gastrointestinal: Soft and nontender. No distention. Musculoskeletal: No obvious deformities Neurologic:  Normal speech and language. No gross focal neurologic deficits are appreciated. Skin:  Skin is warm and dry. No rash noted. Psychiatric: Mood and affect are normal. Speech and behavior are normal.  ____________________________________________   LABS (all labs ordered are listed, but only abnormal results are displayed)  Labs Reviewed  BASIC METABOLIC PANEL - Abnormal; Notable for the following components:      Result Value   Glucose, Bld 164 (*)    BUN 24  (*)    All other components within normal limits  CBC  URINALYSIS, COMPLETE (UACMP) WITH MICROSCOPIC  CBG MONITORING, ED   ____________________________________________  EKG  ED ECG REPORT I, Naaman Plummer, the attending physician, personally viewed and interpreted this ECG.  Date: 09/23/2020 EKG Time: 1330 Rate: 82 Rhythm: normal sinus rhythm QRS Axis: normal Intervals: normal ST/T Wave abnormalities: normal Narrative Interpretation: no evidence of acute ischemia  PROCEDURES  Procedure(s) performed (including Critical Care):  .1-3 Lead EKG Interpretation Performed by: Naaman Plummer, MD Authorized by: Naaman Plummer, MD     Interpretation: normal     ECG rate:  65   ECG rate assessment: normal     Rhythm: sinus rhythm     Ectopy: none     Conduction: normal       ____________________________________________  INITIAL IMPRESSION / ASSESSMENT AND PLAN / ED COURSE  As part of my medical decision making, I reviewed the following data within the [place] notes reviewed and incorporated, Labs reviewed, EKG interpreted, Old chart reviewed, and Notes from prior ED visits reviewed and incorporated        Patient presents with complaints of syncope/presyncope ED Workup:  CBC, BMP, Troponin, BNP, ECG, CXR Differential diagnosis includes HF, ICH, seizure, stroke, HOCM, ACS, aortic dissection, malignant arrhythmia, or GI bleed. Findings: No evidence of acute laboratory abnormalities.  Troponin negative x1 EKG: No e/o STEMI. No evidence of Brugadas sign, delta wave, epsilon wave, significantly prolonged QTc, or malignant arrhythmia.  Disposition: Discharge. Patient is at baseline at this time. Return precautions expressed and understood in person. Advised follow up with primary care provider or clinic physician in next 24 hours.      ____________________________________________   FINAL CLINICAL IMPRESSION(S) / ED DIAGNOSES  Final  diagnoses:  Vasovagal syncope     ED Discharge Orders    None       Note:  This document was prepared using Dragon voice recognition software and may include unintentional dictation errors.   Naaman Plummer, MD 09/23/20 [phone]

## 2021-06-17 ENCOUNTER — Other Ambulatory Visit: Payer: Self-pay | Admitting: Family Medicine

## 2021-06-17 DIAGNOSIS — Z803 Family history of malignant neoplasm of breast: Secondary | ICD-10-CM

## 2021-06-17 DIAGNOSIS — R921 Mammographic calcification found on diagnostic imaging of breast: Secondary | ICD-10-CM

## 2021-07-10 ENCOUNTER — Ambulatory Visit
Admission: RE | Admit: 2021-07-10 | Discharge: 2021-07-10 | Disposition: A | Discharge status: Home / Self Care | Payer: 59 | Source: Ambulatory Visit | Attending: Family Medicine | Admitting: Family Medicine

## 2021-07-10 ENCOUNTER — Other Ambulatory Visit: Payer: Self-pay

## 2021-07-10 DIAGNOSIS — R921 Mammographic calcification found on diagnostic imaging of breast: Secondary | ICD-10-CM

## 2021-07-10 DIAGNOSIS — Z803 Family history of malignant neoplasm of breast: Secondary | ICD-10-CM

## 2021-07-10 MED ORDER — GADOBUTROL 1 MMOL/ML IV SOLN
6.0000 mL | Freq: Once | INTRAVENOUS | Status: AC | PRN
  Administered 2021-07-10: 6 mL via INTRAVENOUS

## 2024-04-09 ENCOUNTER — Other Ambulatory Visit: Payer: Self-pay | Admitting: Family Medicine

## 2024-04-09 DIAGNOSIS — Z1231 Encounter for screening mammogram for malignant neoplasm of breast: Secondary | ICD-10-CM

## 2024-06-14 ENCOUNTER — Inpatient Hospital Stay
Admission: RE | Admit: 2024-06-14 | Discharge: 2024-06-14 | Payer: Medicare (Managed Care) | Attending: Family Medicine | Admitting: Family Medicine

## 2024-06-14 DIAGNOSIS — Z1231 Encounter for screening mammogram for malignant neoplasm of breast: Secondary | ICD-10-CM

## 2024-07-17 ENCOUNTER — Other Ambulatory Visit: Payer: Self-pay | Admitting: Family Medicine

## 2024-07-17 DIAGNOSIS — Z136 Encounter for screening for cardiovascular disorders: Secondary | ICD-10-CM

## 2024-07-22 ENCOUNTER — Ambulatory Visit
Admission: RE | Admit: 2024-07-22 | Discharge: 2024-07-22 | Disposition: A | Discharge status: Home / Self Care | Payer: Medicare (Managed Care) | Source: Ambulatory Visit | Attending: Family Medicine | Admitting: Family Medicine

## 2024-07-22 DIAGNOSIS — Z136 Encounter for screening for cardiovascular disorders: Secondary | ICD-10-CM | POA: Insufficient documentation
